# Patient Record
Sex: Female | Born: 1985
Health system: Southern US, Community
[De-identification: ages and names within clinical notes are randomized; demographics above are authoritative.]

## PROBLEM LIST (undated history)

## (undated) ENCOUNTER — Inpatient Hospital Stay (HOSPITAL_COMMUNITY): Payer: Self-pay

## (undated) DIAGNOSIS — I00 Rheumatic fever without heart involvement: Secondary | ICD-10-CM

## (undated) DIAGNOSIS — K219 Gastro-esophageal reflux disease without esophagitis: Secondary | ICD-10-CM

## (undated) DIAGNOSIS — D649 Anemia, unspecified: Secondary | ICD-10-CM

## (undated) DIAGNOSIS — G47419 Narcolepsy without cataplexy: Secondary | ICD-10-CM

## (undated) DIAGNOSIS — J302 Other seasonal allergic rhinitis: Secondary | ICD-10-CM

## (undated) HISTORY — PX: TONSILLECTOMY: SUR1361

## (undated) HISTORY — PX: WISDOM TOOTH EXTRACTION: SHX21

---

## 2014-02-23 NOTE — L&D Delivery Note (Signed)
Final Labor Progress Note Around midnight pt arrived to MAU c/o ROM at 2245.  I was reported she was 3cm.  When she arrived on L&D the primary care nurse check and she was 6-7cm.  ABX was changed from PCN to AMP for advanced labor.  Intermitting monitored was order, pt ambulating in the room, using the birth ball, and the peanut ball.  At 0200 pt denies feeling any pressure.  At 0415 I was called to the room for a decel.  FSE applied, scalp stim, O2, IVF bolus.  Faculty attending and Dr Normand Sloop called to the bedside at 0430.  Dr Emelda Fear at the bedside at 0432, FHR up to 120's.  FHT remained in the 120 with occasional variable decel, over all remain reassuring.  Dr Normand Sloop at the bedside at [phone].  Pushing continued, Dr Normand Sloop in the room at all times.    Vaginal Delivery Note Spontaneous rupture of membranes Sept 4, 2016 at 2245, clear.  GBS was positive, AMP x 2 doses were given.  Cervical dilation was complete at  0420.  NICHD Category 2.    Pushing with guidance began at  0420.   After 1 hour(s) and  48 minutes of pushing the head, shoulders and the body of a viable female infant "[name]" delivered spontaneously with maternal effort in the ROA position at 0608.   With vigorous tone and spontaneous cry, the infant was placed on moms abd.  After the umbilical cord was clamped it was cut by the FOB, then cord blood was obtained for evaluation. Spontaneous delivery of a intact placenta with a 3 vessel cord via Shultz at [phone].   Episiotomy: None   The vulva, perineum, vaginal vault, rectum and cervix were inspected and revealed a 1 degree vaginal, repaired using a 3-0 vicryl on a CT needle and a superficial bilateral inner labial which was repaired using a 4-0 vicryl on a SH needle with 30cc of 1% lidocaine. Postpartum pitocin as ordered.  Fundus firm, lochia minimum, bleeding under control. EBL 150, Pt hemodynamically stable.   Sponge, laps and needle count correct and verified with the primary care  nurse.  Attending MD available at all times.    Routine postpartum orders   Mother unsure about method of contraception Infant to have in patient circumcision   Placenta to pathology: NO     Cord Gases sent to lab: NO Cord blood sent to lab: YES   APGARS:  8 at 1 minute and 9 at 5 minutes Weight:. 8lb 14.2oz     Both mom and baby were left in stable condition, baby skin to skin.      Erin Garner, CNM, MSN 10/29/2014. 7:48 AM

## 2014-03-07 LAB — OB RESULTS CONSOLE ABO/RH: RH Type: POSITIVE

## 2014-03-07 LAB — OB RESULTS CONSOLE HEPATITIS B SURFACE ANTIGEN: Hepatitis B Surface Ag: NEGATIVE

## 2014-03-07 LAB — OB RESULTS CONSOLE GC/CHLAMYDIA
CHLAMYDIA, DNA PROBE: NEGATIVE
GC PROBE AMP, GENITAL: NEGATIVE

## 2014-03-07 LAB — OB RESULTS CONSOLE RPR: RPR: NONREACTIVE

## 2014-03-07 LAB — OB RESULTS CONSOLE RUBELLA ANTIBODY, IGM: Rubella: NON-IMMUNE/NOT IMMUNE

## 2014-03-07 LAB — OB RESULTS CONSOLE ANTIBODY SCREEN: ANTIBODY SCREEN: NEGATIVE

## 2014-03-07 LAB — OB RESULTS CONSOLE HIV ANTIBODY (ROUTINE TESTING): HIV: NONREACTIVE

## 2014-10-02 LAB — OB RESULTS CONSOLE GBS: STREP GROUP B AG: POSITIVE

## 2014-10-28 ENCOUNTER — Encounter (HOSPITAL_COMMUNITY): Payer: Self-pay | Admitting: *Deleted

## 2014-10-28 ENCOUNTER — Inpatient Hospital Stay (HOSPITAL_COMMUNITY)
Admission: AD | Admit: 2014-10-28 | Discharge: 2014-10-31 | DRG: 775 | Disposition: A | Discharge status: Home / Self Care | Payer: BLUE CROSS/BLUE SHIELD | Source: Ambulatory Visit | Attending: Obstetrics and Gynecology | Admitting: Obstetrics and Gynecology

## 2014-10-28 DIAGNOSIS — O9902 Anemia complicating childbirth: Principal | ICD-10-CM | POA: Diagnosis present

## 2014-10-28 DIAGNOSIS — B951 Streptococcus, group B, as the cause of diseases classified elsewhere: Secondary | ICD-10-CM | POA: Diagnosis present

## 2014-10-28 DIAGNOSIS — O99824 Streptococcus B carrier state complicating childbirth: Secondary | ICD-10-CM | POA: Diagnosis present

## 2014-10-28 DIAGNOSIS — Z3A39 39 weeks gestation of pregnancy: Secondary | ICD-10-CM | POA: Diagnosis present

## 2014-10-28 DIAGNOSIS — G47419 Narcolepsy without cataplexy: Secondary | ICD-10-CM | POA: Diagnosis present

## 2014-10-28 DIAGNOSIS — D649 Anemia, unspecified: Secondary | ICD-10-CM | POA: Diagnosis present

## 2014-10-28 DIAGNOSIS — Z882 Allergy status to sulfonamides status: Secondary | ICD-10-CM

## 2014-10-28 HISTORY — DX: Rheumatic fever without heart involvement: I00

## 2014-10-28 NOTE — MAU Note (Signed)
Contractions off/on, ? ROM at 2245.

## 2014-10-29 ENCOUNTER — Inpatient Hospital Stay (HOSPITAL_COMMUNITY): Payer: BLUE CROSS/BLUE SHIELD | Admitting: Anesthesiology

## 2014-10-29 ENCOUNTER — Encounter (HOSPITAL_COMMUNITY): Payer: Self-pay | Admitting: *Deleted

## 2014-10-29 DIAGNOSIS — D649 Anemia, unspecified: Secondary | ICD-10-CM | POA: Diagnosis present

## 2014-10-29 DIAGNOSIS — Z3A39 39 weeks gestation of pregnancy: Secondary | ICD-10-CM | POA: Diagnosis present

## 2014-10-29 DIAGNOSIS — Z882 Allergy status to sulfonamides status: Secondary | ICD-10-CM | POA: Diagnosis not present

## 2014-10-29 DIAGNOSIS — G47419 Narcolepsy without cataplexy: Secondary | ICD-10-CM | POA: Diagnosis present

## 2014-10-29 DIAGNOSIS — O9902 Anemia complicating childbirth: Secondary | ICD-10-CM | POA: Diagnosis present

## 2014-10-29 DIAGNOSIS — O99824 Streptococcus B carrier state complicating childbirth: Secondary | ICD-10-CM | POA: Diagnosis present

## 2014-10-29 DIAGNOSIS — B951 Streptococcus, group B, as the cause of diseases classified elsewhere: Secondary | ICD-10-CM | POA: Diagnosis present

## 2014-10-29 LAB — CBC
HEMATOCRIT: 32.7 % — AB (ref 36.0–46.0)
Hemoglobin: 10.9 g/dL — ABNORMAL LOW (ref 12.0–15.0)
MCH: 29.4 pg (ref 26.0–34.0)
MCHC: 33.3 g/dL (ref 30.0–36.0)
MCV: 88.1 fL (ref 78.0–100.0)
Platelets: 137 10*3/uL — ABNORMAL LOW (ref 150–400)
RBC: 3.71 MIL/uL — AB (ref 3.87–5.11)
RDW: 14 % (ref 11.5–15.5)
WBC: 10 10*3/uL (ref 4.0–10.5)

## 2014-10-29 LAB — TYPE AND SCREEN
ABO/RH(D): AB POS
Antibody Screen: NEGATIVE

## 2014-10-29 LAB — AMNISURE RUPTURE OF MEMBRANE (ROM) NOT AT ARMC: AMNISURE: POSITIVE

## 2014-10-29 LAB — ABO/RH: ABO/RH(D): AB POS

## 2014-10-29 LAB — RPR: RPR: NONREACTIVE

## 2014-10-29 MED ORDER — ACETAMINOPHEN 325 MG PO TABS: 650.0000 mg | ORAL_TABLET | ORAL | Status: DC | PRN

## 2014-10-29 MED ORDER — EPHEDRINE 5 MG/ML INJ
10.0000 mg | INTRAVENOUS | Status: DC | PRN
  Filled 2014-10-29: qty 2

## 2014-10-29 MED ORDER — ZOLPIDEM TARTRATE 5 MG PO TABS: 5.0000 mg | ORAL_TABLET | Freq: Every evening | ORAL | Status: DC | PRN

## 2014-10-29 MED ORDER — LIDOCAINE HCL (PF) 1 % IJ SOLN
30.0000 mL | INTRAMUSCULAR | Status: DC | PRN
  Administered 2014-10-29: 30 mL via SUBCUTANEOUS
  Filled 2014-10-29: qty 30

## 2014-10-29 MED ORDER — SENNOSIDES-DOCUSATE SODIUM 8.6-50 MG PO TABS
2.0000 | ORAL_TABLET | ORAL | Status: DC
  Administered 2014-10-29 – 2014-10-30 (×2): 2 via ORAL
  Filled 2014-10-29 (×3): qty 2

## 2014-10-29 MED ORDER — WITCH HAZEL-GLYCERIN EX PADS: 1.0000 "application " | MEDICATED_PAD | CUTANEOUS | Status: DC | PRN

## 2014-10-29 MED ORDER — ONDANSETRON HCL 4 MG PO TABS: 4.0000 mg | ORAL_TABLET | ORAL | Status: DC | PRN

## 2014-10-29 MED ORDER — OXYCODONE-ACETAMINOPHEN 5-325 MG PO TABS
2.0000 | ORAL_TABLET | ORAL | Status: DC | PRN
Start: 2014-10-29 — End: 2014-10-29

## 2014-10-29 MED ORDER — LACTATED RINGERS IV SOLN
INTRAVENOUS | Status: DC
  Administered 2014-10-29: 01:00:00 via INTRAVENOUS

## 2014-10-29 MED ORDER — FENTANYL 2.5 MCG/ML BUPIVACAINE 1/10 % EPIDURAL INFUSION (WH - ANES)
14.0000 mL/h | INTRAMUSCULAR | Status: DC | PRN
  Filled 2014-10-29: qty 125

## 2014-10-29 MED ORDER — IBUPROFEN 600 MG PO TABS
600.0000 mg | ORAL_TABLET | Freq: Four times a day (QID) | ORAL | Status: DC
  Administered 2014-10-29 – 2014-10-31 (×10): 600 mg via ORAL
  Filled 2014-10-29 (×11): qty 1

## 2014-10-29 MED ORDER — OXYCODONE-ACETAMINOPHEN 5-325 MG PO TABS
1.0000 | ORAL_TABLET | ORAL | Status: DC | PRN
  Administered 2014-10-29: 1 via ORAL
  Filled 2014-10-29: qty 1

## 2014-10-29 MED ORDER — OXYTOCIN BOLUS FROM INFUSION: 500.0000 mL | INTRAVENOUS | Status: DC

## 2014-10-29 MED ORDER — TETANUS-DIPHTH-ACELL PERTUSSIS 5-2.5-18.5 LF-MCG/0.5 IM SUSP: 0.5000 mL | Freq: Once | INTRAMUSCULAR | Status: DC

## 2014-10-29 MED ORDER — OXYTOCIN 40 UNITS IN LACTATED RINGERS INFUSION - SIMPLE MED
62.5000 mL/h | INTRAVENOUS | Status: DC
  Administered 2014-10-29: 62.5 mL/h via INTRAVENOUS
  Filled 2014-10-29: qty 1000

## 2014-10-29 MED ORDER — OXYTOCIN 10 UNIT/ML IJ SOLN
INTRAMUSCULAR | Status: AC
  Filled 2014-10-29: qty 1

## 2014-10-29 MED ORDER — SODIUM CHLORIDE 0.9 % IV SOLN
2.0000 g | Freq: Four times a day (QID) | INTRAVENOUS | Status: DC
  Administered 2014-10-29: 2 g via INTRAVENOUS
  Filled 2014-10-29 (×3): qty 2000

## 2014-10-29 MED ORDER — LACTATED RINGERS IV SOLN: 500.0000 mL | INTRAVENOUS | Status: DC | PRN

## 2014-10-29 MED ORDER — LANOLIN HYDROUS EX OINT: TOPICAL_OINTMENT | CUTANEOUS | Status: DC | PRN

## 2014-10-29 MED ORDER — OXYCODONE-ACETAMINOPHEN 5-325 MG PO TABS: 2.0000 | ORAL_TABLET | ORAL | Status: DC | PRN

## 2014-10-29 MED ORDER — ONDANSETRON HCL 4 MG/2ML IJ SOLN: 4.0000 mg | INTRAMUSCULAR | Status: DC | PRN

## 2014-10-29 MED ORDER — OXYCODONE-ACETAMINOPHEN 5-325 MG PO TABS: 1.0000 | ORAL_TABLET | ORAL | Status: DC | PRN

## 2014-10-29 MED ORDER — PHENYLEPHRINE 40 MCG/ML (10ML) SYRINGE FOR IV PUSH (FOR BLOOD PRESSURE SUPPORT)
80.0000 ug | PREFILLED_SYRINGE | INTRAVENOUS | Status: DC | PRN
  Filled 2014-10-29: qty 20
  Filled 2014-10-29: qty 2

## 2014-10-29 MED ORDER — NALBUPHINE HCL 10 MG/ML IJ SOLN
5.0000 mg | INTRAMUSCULAR | Status: DC | PRN
  Filled 2014-10-29: qty 0.5

## 2014-10-29 MED ORDER — BENZOCAINE-MENTHOL 20-0.5 % EX AERO: 1.0000 "application " | INHALATION_SPRAY | CUTANEOUS | Status: DC | PRN

## 2014-10-29 MED ORDER — PENICILLIN G POTASSIUM 5000000 UNITS IJ SOLR
5.0000 10*6.[IU] | Freq: Once | INTRAVENOUS | Status: DC
  Filled 2014-10-29: qty 5

## 2014-10-29 MED ORDER — DIPHENHYDRAMINE HCL 50 MG/ML IJ SOLN: 12.5000 mg | INTRAMUSCULAR | Status: DC | PRN

## 2014-10-29 MED ORDER — CITRIC ACID-SODIUM CITRATE 334-500 MG/5ML PO SOLN
30.0000 mL | ORAL | Status: DC | PRN
  Filled 2014-10-29 (×2): qty 15

## 2014-10-29 MED ORDER — PENICILLIN G POTASSIUM 5000000 UNITS IJ SOLR
2.5000 10*6.[IU] | INTRAVENOUS | Status: DC
  Filled 2014-10-29 (×4): qty 2.5

## 2014-10-29 MED ORDER — MEASLES, MUMPS & RUBELLA VAC ~~LOC~~ INJ
0.5000 mL | INJECTION | Freq: Once | SUBCUTANEOUS | Status: AC
  Administered 2014-10-31: 0.5 mL via SUBCUTANEOUS
  Filled 2014-10-29 (×2): qty 0.5

## 2014-10-29 MED ORDER — DIBUCAINE 1 % RE OINT: 1.0000 "application " | TOPICAL_OINTMENT | RECTAL | Status: DC | PRN

## 2014-10-29 MED ORDER — DIPHENHYDRAMINE HCL 25 MG PO CAPS: 25.0000 mg | ORAL_CAPSULE | Freq: Four times a day (QID) | ORAL | Status: DC | PRN

## 2014-10-29 MED ORDER — SIMETHICONE 80 MG PO CHEW: 80.0000 mg | CHEWABLE_TABLET | ORAL | Status: DC | PRN

## 2014-10-29 MED ORDER — ONDANSETRON HCL 4 MG/2ML IJ SOLN: 4.0000 mg | Freq: Four times a day (QID) | INTRAMUSCULAR | Status: DC | PRN

## 2014-10-29 MED ORDER — PRENATAL MULTIVITAMIN CH
1.0000 | ORAL_TABLET | Freq: Every day | ORAL | Status: DC
  Administered 2014-10-29 – 2014-10-31 (×3): 1 via ORAL
  Filled 2014-10-29 (×3): qty 1

## 2014-10-29 NOTE — Anesthesia Preprocedure Evaluation (Addendum)
Anesthesia Evaluation  Patient identified by MRN, date of birth, ID band Patient awake    Reviewed: Allergy & Precautions, H&P , Patient's Chart, lab work & pertinent test results  Airway Mallampati: I  TM Distance: >3 FB Neck ROM: full    Dental no notable dental hx.    Pulmonary neg pulmonary ROS,    Pulmonary exam normal        Cardiovascular negative cardio ROS Normal cardiovascular exam     Neuro/Psych negative neurological ROS  negative psych ROS   GI/Hepatic negative GI ROS, Neg liver ROS,   Endo/Other  negative endocrine ROS  Renal/GU negative Renal ROS     Musculoskeletal   Abdominal Normal abdominal exam  (+)   Peds  Hematology negative hematology ROS (+)   Anesthesia Other Findings   Reproductive/Obstetrics (+) Pregnancy                             Anesthesia Physical Anesthesia Plan  ASA: II  Anesthesia Plan: Epidural   Post-op Pain Management:    Induction:   Airway Management Planned:   Additional Equipment:   Intra-op Plan:   Post-operative Plan:   Informed Consent: I have reviewed the patients History and Physical, chart, labs and discussed the procedure including the risks, benefits and alternatives for the proposed anesthesia with the patient or authorized representative who has indicated his/her understanding and acceptance.     Plan Discussed with:   Anesthesia Plan Comments:         Anesthesia Quick Evaluation  

## 2014-10-29 NOTE — Lactation Note (Addendum)
This note was copied from the chart of Erin Garner. Lactation Consultation Note  P1, Baby 11 hours old. Breastfedx7, 1 stool, no void. Mother knows how to hand express and has viewed colostrum. Latched in cradle upon entering.  Discussed how to latch in cross cradle to increase depth and massage to keep him active. Reviewed how to [facility], basics and cluster feeding. Discussed pp 20-24 Baby & Me booklet. Mom encouraged to feed baby 8-12 times/24 hours and with feeding cues.  Mom made aware of O/P services, breastfeeding support groups, community resources, and our phone # for post-discharge questions.     Patient Name: Erin Garner [id number] Date: 10/29/2014 Reason for consult: Initial assessment   Maternal Data Has patient been taught Hand Expression?: Yes Does the patient have breastfeeding experience prior to this delivery?: No  Feeding Feeding Type: Breast Fed Length of feed: 20 min  LATCH Score/Interventions Latch: Grasps breast easily, tongue down, lips flanged, rhythmical sucking.  Audible Swallowing: A few with stimulation  Type of Nipple: Everted at rest and after stimulation  Comfort (Breast/Nipple): Soft / non-tender     Hold (Positioning): No assistance needed to correctly position infant at breast.  LATCH Score: 9  Lactation Tools Discussed/Used     Consult Status Consult Status: Follow-up Date: 10/30/14 Follow-up type: In-patient    Dahlia Byes [facility] 10/29/2014, 5:46 PM

## 2014-10-29 NOTE — H&P (Signed)
Erin Garner is a 29 y.o. female, G1 P0 at 39.5 weeks arrived in MAU SROM at 2245 clear.    Patient Active Problem List   Diagnosis Date Noted  . Normal labor 10/29/2014    Pregnancy Course: Patient entered care at 8 weeks.   EDC of 10/31/14 was established by LMP.      [place] evaluations:   8.0 weeks - Dating U/S.  Singleton IUP. + FHT's of 168 bpm. Anteverted ut. Amnion and YS seen. Normal fluid. CRL = GA of [redacted]w[redacted]d. EDD 10/31/14. U/S is best dating. Cx closed. Nl ovaries/adnexas.  19.5 weeks - Anatomy: FHR 153, Ht chambers not seen, breech, posterior placenta, no previa      23.3 weeks FU: EFW 1lb 7oz - 57%, FHR 143, cervix 3.68, breech, posterior placenta, normal fluid, when evaluating the pulmonary veins and pulmonary artery there are vessels that can not be accounted for.  Possibel anomalous pulmonary vellels, suggest fetal echo     Significant prenatal events:   Anemia, Narcolepsy, allergy to sulfa   Last evaluation:   39.0 weeks   VE:2/50/-1  Reason for admission:  SROM  Pt States:   Contractions Frequency: 3-5         Contraction severity: moderate         Fetal activity: +FM  OB History    Gravida Para Term Preterm AB TAB SAB Ectopic Multiple Living   1              Past Medical History  Diagnosis Date  . Rheumatic fever     as a child   Past Surgical History  Procedure Laterality Date  . Tonsillectomy    . Wisdom tooth extraction     Family History: family history is not on file. Social History:  reports that she has never smoked. She has never used smokeless tobacco. She reports that she does not drink alcohol or use illicit drugs.   Prenatal Transfer Tool  Maternal Diabetes: No Genetic Screening: Normal Maternal Ultrasounds/Referrals: Normal Fetal Ultrasounds or other Referrals:  Referred to Materal Fetal Medicine for fetal echo Maternal Substance Abuse:  No Significant Maternal Medications:  None Significant Maternal Lab Results: Lab values include: Group  B Strep positive   ROS:  See HPI above, all other systems are negative  Allergies  Allergen Reactions  . Sulfa Antibiotics Hives      Blood pressure 136/74, pulse 79, temperature 97.8 F (36.6 C), temperature source Oral, resp. rate 16, height 5\' 6"  (1.676 m), weight 179 lb (81.194 kg), SpO2 100 %.  Maternal Exam:  Uterine Assessment: Contraction frequency is rare.  Abdomen: Gravid, non tender. Fundal height is aga.  Normal external genitalia, vulva, cervix, uterus and adnexa.  No lesions noted on exam.  Pelvis adequate for delivery.  Fetal presentation: Vertex by Leopold's   Fetal Exam:  Monitor Surveillance : Continuous Monitoring  Mode: Ultrasound.  NICHD: Category 1 CTXs: Q 3-32minutes EFW   7 lbs  Physical Exam: Nursing note and vitals reviewed General: alert and cooperative She appears well nourished Psychiatric: Normal mood and affect. Her behavior is normal Head: Normocephalic Eyes: Pupils are equal, round, and reactive to light Neck: Normal range of motion Cardiovascular: RRR without murmur  Respiratory: CTAB. Effort normal  Abd: soft, non-tender, +BS, no rebound, no guarding  Genitourinary: Vagina normal  Neurological: A&Ox3 Skin: Warm and dry  Musculoskeletal: Normal range of motion  Homan's sign negative bilaterally No evidence of DVTs.  Edema: Minimal bilaterally non-pitting  edema DTR: 2+ Clonus: None   Prenatal labs: ABO, Rh: AB/Positive/-- (01/13 0000) Antibody: Negative (01/13 0000) Rubella:   Non Immune RPR: Nonreactive (01/13 0000)  HBsAg: Negative (01/13 0000)  HIV: Non-reactive (01/13 0000)  GBS: Positive (08/09 0000) Sickle cell/Hgb electrophoresis:  WNL Pap:   GC:   negative Chlamydia: negative Genetic screenings:   Glucola:    Assessment:  IUP at 39.5 weeks NICHD: Category 1 Membranes: SROM x 2hrs GBS positive VE 6-7/100-1 per nurse exam  Plan:  Admit to L&D for expectant/active management of labor. Possible augmentation  options reviewed including pitocin.   GBS prophylaxis with Ampicillin for advance labor  IV pain medication per orders PRN Epidural per patient request Foley cath after patient is comfortable with epidural Anticipate SVD Labor mgmt as ordered Okay to ambulate around unit with wireless monitors  Okay to get up and shower without monitoring   May auscultate FHR intermittently,  if expectant management     q 30 min in active labor - x 5 minutes     q 15 min in transition - before during and after a ctx     q 5 min with pushing - before during and after a ctx.     May ambulate without monitoring.     If no active labor, may do NST q 2 hours.   Attending MD available at all times.  Kenae Lindquist, CNM, MSN 10/29/2014, 12:50 AM

## 2014-10-30 LAB — CBC
HEMATOCRIT: 30.5 % — AB (ref 36.0–46.0)
HEMOGLOBIN: 9.9 g/dL — AB (ref 12.0–15.0)
MCH: 28.5 pg (ref 26.0–34.0)
MCHC: 32.5 g/dL (ref 30.0–36.0)
MCV: 87.9 fL (ref 78.0–100.0)
Platelets: 144 10*3/uL — ABNORMAL LOW (ref 150–400)
RBC: 3.47 MIL/uL — ABNORMAL LOW (ref 3.87–5.11)
RDW: 14.3 % (ref 11.5–15.5)
WBC: 14.5 10*3/uL — ABNORMAL HIGH (ref 4.0–10.5)

## 2014-10-30 NOTE — Lactation Note (Signed)
This note was copied from the chart of Erin Hilarie Sinha. Lactation Consultation Note; Mom reports that baby just finished feeding for 45 min. Reports baby has been nursing a lot today. Reviewed cluster feeding and encouraged to feed whenever she sees feeding cues. Reviewed engorgement prevention and treatment. Is going to get pump from insurance company at [employer]. No questions at present. To call prn  Patient Name: Erin Garner [id number] Date: 10/30/2014 Reason for consult: Follow-up assessment   Maternal Data Formula Feeding for Exclusion: No Does the patient have breastfeeding experience prior to this delivery?: No  Feeding Feeding Type: Breast Milk Length of feed: 45 min  LATCH Score/Interventions                      Lactation Tools Discussed/Used     Consult Status Consult Status: PRN    Pamelia Hoit 10/30/2014, 3:09 PM

## 2014-10-30 NOTE — Progress Notes (Signed)
Subjective: Postpartum Day 1: Vaginal delivery, 1st degree vaginal laceration, superficial bilateral inner labial lacerations Patient up ad lib, reports no syncope or dizziness. Feeding:  Breast Contraceptive plan:  Undecided at present  Using Motrin for pain with benefit.  Objective: Vital signs in last 24 hours: Temp:  [97.8 F (36.6 C)-98.1 F (36.7 C)] 97.8 F (36.6 C) (09/06 0535) Pulse Rate:  [70-77] 77 (09/06 0535) Resp:  [18] 18 (09/06 0535) BP: (110-121)/(59-72) 121/72 mmHg (09/06 0535)  Physical Exam:  General: alert, eyes with petechial hemorrhages from pushing Lochia: appropriate Uterine Fundus: firm Perineum: healing well DVT Evaluation: No evidence of DVT seen on physical exam. Negative Homan's sign.  CBC Latest Ref Rng 10/30/2014 10/29/2014  WBC 4.0 - 10.5 K/uL 14.5(H) 10.0  Hemoglobin 12.0 - 15.0 g/dL 3.2(G) 10.9(L)  Hematocrit 36.0 - 46.0 % 30.5(L) 32.7(L)  Platelets 150 - 400 K/uL 144(L) 137(L)  Orthostatics stable.    Assessment/Plan: Status post vaginal delivery day 1. Stable Continue current care. Takes Fe supplement at home--will continue pp. Plan for discharge tomorrow    Erin Garner 10/30/2014, 11:33 AM

## 2014-10-31 MED ORDER — OXYCODONE-ACETAMINOPHEN 5-325 MG PO TABS: 1.0000 | ORAL_TABLET | ORAL | Status: DC | PRN

## 2014-10-31 MED ORDER — IBUPROFEN 600 MG PO TABS: 600.0000 mg | ORAL_TABLET | Freq: Four times a day (QID) | ORAL | Status: DC

## 2014-10-31 NOTE — Lactation Note (Signed)
This note was copied from the chart of Erin Garner. Lactation Consultation Note  Patient Name: Erin Garner [id number] Date: 10/31/2014 Reason for consult: Follow-up assessment   Called back to room to assess latch. Baby initially latched in cradle position to right breast. Baby's lower lip tucked in and there was a clicking noise while baby nursing. Assisted mom to move into cross-cradle position and demonstrated how to flanger lower lip outward. Baby able to maintain a deep latch and intermittent swallows noted. Enc mom to maintain deep latch for good milk transfer.  Maternal Data    Feeding Feeding Type: Breast Fed Length of feed:  (LC assessed first 10 minutes of BF.)  LATCH Score/Interventions Latch: Grasps breast easily, tongue down, lips flanged, rhythmical sucking.  Audible Swallowing: Spontaneous and intermittent  Type of Nipple: Everted at rest and after stimulation  Comfort (Breast/Nipple): Soft / non-tender     Hold (Positioning): Assistance needed to correctly position infant at breast and maintain latch. Intervention(s): Support Pillows  LATCH Score: 9  Lactation Tools Discussed/Used     Consult Status Consult Status: PRN    Geralynn Ochs 10/31/2014, 1:31 PM

## 2014-10-31 NOTE — Lactation Note (Signed)
This note was copied from the chart of Erin Garner. Lactation Consultation Note  Patient Name: Erin Ayse Mccartin [id number] Date: 10/31/2014 Reason for consult: Follow-up assessment Baby 54 hours old. Mom called for [facility] with question about possible "blisters" on right nipple. This LC could not see a blister on nipple. Mom states that she is not have any nipple. Discussed normal nipple discomfort, and enc mom to maintain a deep latch while baby nursing. Mom states that her milk is "coming in" and she is feeling tight. Discussed engorgement prevention/treatment. Discussed longer nursing times with mom--1.5 hours and one for 2 hours. Mom states that baby is nursing 20-30 minutes at a time now. Baby is going home on phototherapy and mom's breasts are filling, so discussed having baby at breast often. Discussed that baby may be sleepy due to hyperbilirubinemia, so enc offering STS every 2-3 hours to see if baby will cue to nurse. Patient's RN, Herbert Seta is going to give mom a hand pump with instructions. Enc mom to use manual pump and hand expression after nursing as needed, and before nursing in order to soften breast for a deep latch as needed as well. Referred mom to Baby and Me handbook for EBM storage guidelines and number of diapers to expect by day of life. Mom aware of OP/BFSG and LC phone line assistance after D/C.   Maternal Data    Feeding    LATCH Score/Interventions                      Lactation Tools Discussed/Used     Consult Status Consult Status: PRN    Geralynn Ochs 10/31/2014, 1:04 PM

## 2014-10-31 NOTE — Discharge Summary (Signed)
Vaginal Delivery Discharge Summary  Erin Garner  DOB:    05-03-1985 MRN:    [id number] CSN:    [id number]  Date of admission:                  10/28/14  Date of discharge:                   10/31/14  Procedures this admission:   SVD  Date of Delivery: 10/29/14  Newborn Data:  Live born female  Birth Weight: 8 lb 14.2 oz (4030 g) APGAR: 8, 9  Home with mother. Name: [name] Circumcision Plan: in patient  History of Present Illness:  [name] is a 29 y.o. female, G1P1001, who presents at [redacted]w[redacted]d weeks gestation. The patient has been followed at [facility] of [facility]. She was admitted for rupture of membranes. Her pregnancy has been complicated by:  Patient Active Problem List   Diagnosis Date Noted  . Vaginal delivery 10/29/2014  . Positive GBS test 10/29/2014     Hospital Course:  The patient was admitted for SROM. Her GBS was positive.  Her labor was not complicated. Delivery was performed by [facility] Shaili Donalson without complication.  She proceeded to have a vaginal delivery of a healthy infant that remained in room with mother. Her postpartum course was not complicated. She was discharged to home in stable condition on postpartum day 2 doing well.  Intrapartum Procedures: spontaneous vaginal delivery and GBS prophylaxis Postpartum Procedures: none Complications-Operative and Postpartum: 1st degree perineal laceration  Discharge Diagnoses: Term Pregnancy-delivered  Feeding:  breast  Contraception:  unsure  Hemoglobin Results:  CBC Latest Ref Rng 10/30/2014 10/29/2014  WBC 4.0 - 10.5 K/uL 14.5(H) 10.0  Hemoglobin 12.0 - 15.0 g/dL 7.8(G) 10.9(L)  Hematocrit 36.0 - 46.0 % 30.5(L) 32.7(L)  Platelets 150 - 400 K/uL 144(L) 137(L)    Discharge Physical Exam:   General: alert and cooperative Lochia: appropriate Uterine Fundus: firm Incision: healing well DVT Evaluation: No evidence of DVT seen  on physical exam.   Discharge Information:  Activity:           pelvic rest Diet:                routine Medications: PNV, Ibuprofen, Iron and Percocet Condition:      stable Instructions:  Routine pp instructions   Discharge to: home      [name], CNM, MSN 10/31/2014. 9:28 AM      Postpartum Care After Vaginal Delivery  After you deliver your newborn (postpartum period), the usual stay in the hospital is 24 72 hours. If there were problems with your labor or delivery, or if you have other medical problems, you might be in the hospital longer.  While you are in the hospital, you will receive help and instructions on how to care for yourself and your newborn during the postpartum period.  While you are in the hospital:  Be sure to tell your nurses if you have pain or discomfort, as well as where you feel the pain and what makes the pain worse.  If you had an incision made near your vagina (episiotomy) or if you had some tearing during delivery, the nurses may put ice packs on your episiotomy or tear. The ice packs may help to reduce the pain and swelling.  If you are breastfeeding, you may feel uncomfortable contractions of your uterus for a couple of weeks. This is normal.  The contractions help your uterus get back to normal size.  It is normal to have some bleeding after delivery.  For the first 1 3 days after delivery, the flow is red and the amount may be similar to a period.  It is common for the flow to start and stop.  In the first few days, you may pass some small clots. Let your nurses know if you begin to pass large clots or your flow increases.  Do not  flush blood clots down the toilet before having the nurse look at them.  During the next 3 10 days after delivery, your flow should become more watery and pink or brown-tinged in color.  Ten to fourteen days after delivery, your flow should be a small amount of yellowish-white discharge.  The amount of  your flow will decrease over the first few weeks after delivery. Your flow may stop in 6 8 weeks. Most women have had their flow stop by 12 weeks after delivery.  You should change your sanitary pads frequently.  Wash your hands thoroughly with soap and water for at least 20 seconds after changing pads, using the toilet, or before holding or feeding your newborn.  You should feel like you need to empty your bladder within the first 6 8 hours after delivery.  In case you become weak, lightheaded, or faint, call your nurse before you get out of bed for the first time and before you take a shower for the first time.  Within the first few days after delivery, your breasts may begin to feel tender and full. This is called engorgement. Breast tenderness usually goes away within 48 72 hours after engorgement occurs. You may also notice milk leaking from your breasts. If you are not breastfeeding, do not stimulate your breasts. Breast stimulation can make your breasts produce more milk.  Spending as much time as possible with your newborn is very important. During this time, you and your newborn can feel close and get to know each other. Having your newborn stay in your room (rooming in) will help to strengthen the bond with your newborn. It will give you time to get to know your newborn and become comfortable caring for your newborn.  Your hormones change after delivery. Sometimes the hormone changes can temporarily cause you to feel sad or tearful. These feelings should not last more than a few days. If these feelings last longer than that, you should talk to your caregiver.  If desired, talk to your caregiver about methods of family planning or contraception.  Talk to your caregiver about immunizations. Your caregiver may want you to have the following immunizations before leaving the hospital:  Tetanus, diphtheria, and pertussis (Tdap) or tetanus and diphtheria (Td) immunization. It is very  important that you and your family (including grandparents) or others caring for your newborn are up-to-date with the Tdap or Td immunizations. The Tdap or Td immunization can help protect your newborn from getting ill.  Rubella immunization.  Varicella (chickenpox) immunization.  Influenza immunization. You should receive this annual immunization if you did not receive the immunization during your pregnancy. Document Released: 12/07/2006 Document Revised: 11/04/2011 Document Reviewed: 10/07/2011 [facility] Patient Information 2014 [place], [place].   Postpartum Depression and Baby Blues  The postpartum period begins right after the birth of a baby. During this time, there is often a great amount of joy and excitement. It is also a time of considerable changes in the life of the parent(s). Regardless of  how many times a mother gives birth, each child brings new challenges and dynamics to the family. It is not unusual to have feelings of excitement accompanied by confusing shifts in moods, emotions, and thoughts. All mothers are at risk of developing postpartum depression or the "baby blues." These mood changes can occur right after giving birth, or they may occur many months after giving birth. The baby blues or postpartum depression can be mild or severe. Additionally, postpartum depression can resolve rather quickly, or it can be a long-term condition. CAUSES Elevated hormones and their rapid decline are thought to be a main cause of postpartum depression and the baby blues. There are a number of hormones that radically change during and after pregnancy. Estrogen and progesterone usually decrease immediately after delivering your baby. The level of thyroid hormone and various cortisol steroids also rapidly drop. Other factors that play a major role in these changes include major life events and genetics.  RISK FACTORS If you have any of the following risks for the baby blues or postpartum depression,  know what symptoms to watch out for during the postpartum period. Risk factors that may increase the likelihood of getting the baby blues or postpartum depression include: 1. Havinga personal or family history of depression. 2. Having depression while being pregnant. 3. Having premenstrual or oral contraceptive-associated mood issues. 4. Having exceptional life stress. 5. Having marital conflict. 6. Lacking a social support network. 7. Having a baby with special needs. 8. Having health problems such as diabetes. SYMPTOMS Baby blues symptoms include:  Brief fluctuations in mood, such as going from extreme happiness to sadness.  Decreased concentration.  Difficulty sleeping.  Crying spells, tearfulness.  Irritability.  Anxiety. Postpartum depression symptoms typically begin within the first month after giving birth. These symptoms include:  Difficulty sleeping or excessive sleepiness.  Marked weight loss.  Agitation.  Feelings of worthlessness.  Lack of interest in activity or food. Postpartum psychosis is a very concerning condition and can be dangerous. Fortunately, it is rare. Displaying any of the following symptoms is cause for immediate medical attention. Postpartum psychosis symptoms include:  Hallucinations and delusions.  Bizarre or disorganized behavior.  Confusion or disorientation. DIAGNOSIS  A diagnosis is made by an evaluation of your symptoms. There are no medical or lab tests that lead to a diagnosis, but there are various questionnaires that a caregiver may use to identify those with the baby blues, postpartum depression, or psychosis. Often times, a screening tool called the [place] Postnatal Depression Scale is used to diagnose depression in the postpartum period.  TREATMENT The baby blues usually goes away on its own in 1 to 2 weeks. Social support is often all that is needed. You should be encouraged to get adequate sleep and rest. Occasionally, you  may be given medicines to help you sleep.  Postpartum depression requires treatment as it can last several months or longer if it is not treated. Treatment may include individual or group therapy, medicine, or both to address any social, physiological, and psychological factors that may play a role in the depression. Regular exercise, a healthy diet, rest, and social support may also be strongly recommended.  Postpartum psychosis is more serious and needs treatment right away. Hospitalization is often needed. HOME CARE INSTRUCTIONS  Get as much rest as you can. Nap when the baby sleeps.  Exercise regularly. Some women find yoga and walking to be beneficial.  Eat a balanced and nourishing diet.  Do little things that you enjoy.  Have a cup of tea, take a bubble bath, read your favorite magazine, or listen to your favorite music.  Avoid alcohol.  Ask for help with household chores, cooking, grocery shopping, or running errands as needed. Do not try to do everything.  Talk to people close to you about how you are feeling. Get support from your partner, family members, friends, or other new moms.  Try to stay positive in how you think. Think about the things you are grateful for.  Do not spend a lot of time alone.  Only take medicine as directed by your caregiver.  Keep all your postpartum appointments.  Let your caregiver know if you have any concerns. SEEK MEDICAL CARE IF: You are having a reaction or problems with your medicine. SEEK IMMEDIATE MEDICAL CARE IF:  You have suicidal feelings.  You feel you may harm the baby or someone else. Document Released: 11/14/2003 Document Revised: 05/04/2011 Document Reviewed: 12/16/2010 [facility] Patient Information 2014 [place], [place].     Breastfeeding Deciding to breastfeed is one of the best choices you can make for you and your baby. A change in hormones during pregnancy causes your breast tissue to grow and increases the number and  size of your milk ducts. These hormones also allow proteins, sugars, and fats from your blood supply to make breast milk in your milk-producing glands. Hormones prevent breast milk from being released before your baby is born as well as prompt milk flow after birth. Once breastfeeding has begun, thoughts of your baby, as well as his or her sucking or crying, can stimulate the release of milk from your milk-producing glands.  BENEFITS OF BREASTFEEDING For Your Baby  Your first milk (colostrum) helps your baby's digestive system function better.   There are antibodies in your milk that help your baby fight off infections.   Your baby has a lower incidence of asthma, allergies, and sudden infant death syndrome.   The nutrients in breast milk are better for your baby than infant formulas and are designed uniquely for your baby's needs.   Breast milk improves your baby's brain development.   Your baby is less likely to develop other conditions, such as childhood obesity, asthma, or type 2 diabetes mellitus.  For You   Breastfeeding helps to create a very special bond between you and your baby.   Breastfeeding is convenient. Breast milk is always available at the correct temperature and costs nothing.   Breastfeeding helps to burn calories and helps you lose the weight gained during pregnancy.   Breastfeeding makes your uterus contract to its prepregnancy size faster and slows bleeding (lochia) after you give birth.   Breastfeeding helps to lower your risk of developing type 2 diabetes mellitus, osteoporosis, and breast or ovarian cancer later in life. SIGNS THAT YOUR BABY IS HUNGRY Early Signs of Hunger  Increased alertness or activity.  Stretching.  Movement of the head from side to side.  Movement of the head and opening of the mouth when the corner of the mouth or cheek is stroked (rooting).  Increased sucking sounds, smacking lips, cooing, sighing, or  squeaking.  Hand-to-mouth movements.  Increased sucking of fingers or hands. Late Signs of Hunger  Fussing.  Intermittent crying. Extreme Signs of Hunger Signs of extreme hunger will require calming and consoling before your baby will be able to breastfeed successfully. Do not wait for the following signs of extreme hunger to occur before you initiate breastfeeding:   Restlessness.  A loud, strong cry.  Screaming.   BREASTFEEDING BASICS Breastfeeding Initiation  Find a comfortable place to sit or lie down, with your neck and back well supported.  Place a pillow or rolled up blanket under your baby to bring him or her to the level of your breast (if you are seated). Nursing pillows are specially designed to help support your arms and your baby while you breastfeed.  Make sure that your baby's abdomen is facing your abdomen.   Gently massage your breast. With your fingertips, massage from your chest wall toward your nipple in a circular motion. This encourages milk flow. You may need to continue this action during the feeding if your milk flows slowly.  Support your breast with 4 fingers underneath and your thumb above your nipple. Make sure your fingers are well away from your nipple and your baby's mouth.   Stroke your baby's lips gently with your finger or nipple.   When your baby's mouth is open wide enough, quickly bring your baby to your breast, placing your entire nipple and as much of the colored area around your nipple (areola) as possible into your baby's mouth.   More areola should be visible above your baby's upper lip than below the lower lip.   Your baby's tongue should be between his or her lower gum and your breast.   Ensure that your baby's mouth is correctly positioned around your nipple (latched). Your baby's lips should create a seal on your breast and be turned out (everted).  It is common for your baby to suck about 2-3 minutes in order to start  the flow of breast milk. Latching Teaching your baby how to latch on to your breast properly is very important. An improper latch can cause nipple pain and decreased milk supply for you and poor weight gain in your baby. Also, if your baby is not latched onto your nipple properly, he or she may swallow some air during feeding. This can make your baby fussy. Burping your baby when you switch breasts during the feeding can help to get rid of the air. However, teaching your baby to latch on properly is still the best way to prevent fussiness from swallowing air while breastfeeding. Signs that your baby has successfully latched on to your nipple:    Silent tugging or silent sucking, without causing you pain.   Swallowing heard between every 3-4 sucks.    Muscle movement above and in front of his or her ears while sucking.  Signs that your baby has not successfully latched on to nipple:   Sucking sounds or smacking sounds from your baby while breastfeeding.  Nipple pain. If you think your baby has not latched on correctly, slip your finger into the corner of your baby's mouth to break the suction and place it between your baby's gums. Attempt breastfeeding initiation again. Signs of Successful Breastfeeding Signs from your baby:   A gradual decrease in the number of sucks or complete cessation of sucking.   Falling asleep.   Relaxation of his or her body.   Retention of a small amount of milk in his or her mouth.   Letting go of your breast by himself or herself. Signs from you:  Breasts that have increased in firmness, weight, and size 1-3 hours after feeding.   Breasts that are softer immediately after breastfeeding.  Increased milk volume, as well as a change in milk consistency and color by the fifth day of breastfeeding.   Nipples that are not  sore, cracked, or bleeding. Signs That Your Pecola Leisure is Getting Enough Milk  Wetting at least 3 diapers in a 24-hour period. The  urine should be clear and pale yellow by age 37 days.  At least 3 stools in a 24-hour period by age 37 days. The stool should be soft and yellow.  At least 3 stools in a 24-hour period by age 60 days. The stool should be seedy and yellow.  No loss of weight greater than 10% of birth weight during the first 11 days of age.  Average weight gain of 4-7 ounces (113-198 g) per week after age 704 days.  Consistent daily weight gain by age 37 days, without weight loss after the age of 2 weeks. After a feeding, your baby may spit up a small amount. This is common. BREASTFEEDING FREQUENCY AND DURATION Frequent feeding will help you make more milk and can prevent sore nipples and breast engorgement. Breastfeed when you feel the need to reduce the fullness of your breasts or when your baby shows signs of hunger. This is called "breastfeeding on demand." Avoid introducing a pacifier to your baby while you are working to establish breastfeeding (the first 4-6 weeks after your baby is born). After this time you may choose to use a pacifier. Research has shown that pacifier use during the first year of a baby's life decreases the risk of sudden infant death syndrome (SIDS). Allow your baby to feed on each breast as long as he or she wants. Breastfeed until your baby is finished feeding. When your baby unlatches or falls asleep while feeding from the first breast, offer the second breast. Because newborns are often sleepy in the first few weeks of life, you may need to awaken your baby to get him or her to feed. Breastfeeding times will vary from baby to baby. However, the following rules can serve as a guide to help you ensure that your baby is properly fed:  Newborns (babies 31 weeks of age or younger) may breastfeed every 1-3 hours.  Newborns should not go longer than 3 hours during the day or 5 hours during the night without breastfeeding.  You should breastfeed your baby a minimum of 8 times in a 24-hour period  until you begin to introduce solid foods to your baby at around 44 months of age. BREAST MILK PUMPING Pumping and storing breast milk allows you to ensure that your baby is exclusively fed your breast milk, even at times when you are unable to breastfeed. This is especially important if you are going back to work while you are still breastfeeding or when you are not able to be present during feedings. Your lactation consultant can give you guidelines on how long it is safe to store breast milk.  A breast pump is a machine that allows you to pump milk from your breast into a sterile bottle. The pumped breast milk can then be stored in a refrigerator or freezer. Some breast pumps are operated by hand, while others use electricity. Ask your lactation consultant which type will work best for you. Breast pumps can be purchased, but some hospitals and breastfeeding support groups lease breast pumps on a monthly basis. A lactation consultant can teach you how to hand express breast milk, if you prefer not to use a pump.  CARING FOR YOUR BREASTS WHILE YOU BREASTFEED Nipples can become dry, cracked, and sore while breastfeeding. The following recommendations can help keep your breasts moisturized and healthy:  Avoid using  soap on your nipples.   Wear a supportive bra. Although not required, special nursing bras and tank tops are designed to allow access to your breasts for breastfeeding without taking off your entire bra or top. Avoid wearing underwire-style bras or extremely tight bras.  Air dry your nipples for 3-63minutes after each feeding.   Use only cotton bra pads to absorb leaked breast milk. Leaking of breast milk between feedings is normal.   Use lanolin on your nipples after breastfeeding. Lanolin helps to maintain your skin's normal moisture barrier. If you use pure lanolin, you do not need to wash it off before feeding your baby again. Pure lanolin is not toxic to your baby. You may also hand  express a few drops of breast milk and gently massage that milk into your nipples and allow the milk to air dry. In the first few weeks after giving birth, some women experience extremely full breasts (engorgement). Engorgement can make your breasts feel heavy, warm, and tender to the touch. Engorgement peaks within 3-5 days after you give birth. The following recommendations can help ease engorgement:  Completely empty your breasts while breastfeeding or pumping. You may want to start by applying warm, moist heat (in the shower or with warm water-soaked hand towels) just before feeding or pumping. This increases circulation and helps the milk flow. If your baby does not completely empty your breasts while breastfeeding, pump any extra milk after he or she is finished.  Wear a snug bra (nursing or regular) or tank top for 1-2 days to signal your body to slightly decrease milk production.  Apply ice packs to your breasts, unless this is too uncomfortable for you.  Make sure that your baby is latched on and positioned properly while breastfeeding. If engorgement persists after 48 hours of following these recommendations, contact your health care provider or a Advertising copywriter. OVERALL HEALTH CARE RECOMMENDATIONS WHILE BREASTFEEDING  Eat healthy foods. Alternate between meals and snacks, eating 3 of each per day. Because what you eat affects your breast milk, some of the foods may make your baby more irritable than usual. Avoid eating these foods if you are sure that they are negatively affecting your baby.  Drink milk, fruit juice, and water to satisfy your thirst (about 10 glasses a day).   Rest often, relax, and continue to take your prenatal vitamins to prevent fatigue, stress, and anemia.  Continue breast self-awareness checks.  Avoid chewing and smoking tobacco.  Avoid alcohol and drug use. Some medicines that may be harmful to your baby can pass through breast milk. It is important  to ask your health care provider before taking any medicine, including all over-the-counter and prescription medicine as well as vitamin and herbal supplements. It is possible to become pregnant while breastfeeding. If birth control is desired, ask your health care provider about options that will be safe for your baby. SEEK MEDICAL CARE IF:   You feel like you want to stop breastfeeding or have become frustrated with breastfeeding.  You have painful breasts or nipples.  Your nipples are cracked or bleeding.  Your breasts are red, tender, or warm.  You have a swollen area on either breast.  You have a fever or chills.  You have nausea or vomiting.  You have drainage other than breast milk from your nipples.  Your breasts do not become full before feedings by the fifth day after you give birth.  You feel sad and depressed.  Your baby is too  sleepy to eat well.  Your baby is having trouble sleeping.   Your baby is wetting less than 3 diapers in a 24-hour period.  Your baby has less than 3 stools in a 24-hour period.  Your baby's skin or the white part of his or her eyes becomes yellow.   Your baby is not gaining weight by 33 days of age. SEEK IMMEDIATE MEDICAL CARE IF:   Your baby is overly tired (lethargic) and does not want to wake up and feed.  Your baby develops an unexplained fever. Document Released: 02/09/2005 Document Revised: 02/14/2013 Document Reviewed: 08/03/2012 [facility] Patient Information 2015 [place], [place]. This information is not intended to replace advice given to you by your health care provider. Make sure you discuss any questions you have with your health care provider.

## 2017-01-22 LAB — OB RESULTS CONSOLE HIV ANTIBODY (ROUTINE TESTING): HIV: NONREACTIVE

## 2017-01-22 LAB — OB RESULTS CONSOLE RUBELLA ANTIBODY, IGM: RUBELLA: NON-IMMUNE/NOT IMMUNE

## 2017-01-22 LAB — OB RESULTS CONSOLE HEPATITIS B SURFACE ANTIGEN: Hepatitis B Surface Ag: NEGATIVE

## 2017-01-22 LAB — OB RESULTS CONSOLE RPR: RPR: NONREACTIVE

## 2017-01-25 LAB — OB RESULTS CONSOLE GC/CHLAMYDIA
Chlamydia: NEGATIVE
Gonorrhea: NEGATIVE

## 2017-02-23 NOTE — L&D Delivery Note (Signed)
Delivery Note At 2047 a viable female was delivered via spontaneous vaginal delivery (Presentation: direct OA).  APGAR: 8, 9; weight pending.    Placenta status: delivered spontaneously and completely.  Cord: three vessels with the following complications: none.    Anesthesia:  Epidural Episiotomy:  N/a Lacerations:  Small superficial periurethral and left labial tear, both were hemostatic and were reapproximated for patient comfort with patient consent  Suture Repair: 4.0 vicryl rapide  Est. Blood Loss (mL):  300  Mom to postpartum.  Baby to Couplet care / Skin to Skin.  Janeece RiggersEllis K Dereck Agerton 09/08/2017, 9:11 PM

## 2017-04-18 ENCOUNTER — Ambulatory Visit: Payer: Self-pay | Admitting: Emergency Medicine

## 2017-04-18 VITALS — BP 100/65 | HR 86 | Temp 98.2°F | Resp 16 | Wt 152.4 lb

## 2017-04-18 DIAGNOSIS — J069 Acute upper respiratory infection, unspecified: Secondary | ICD-10-CM

## 2017-04-18 DIAGNOSIS — R6889 Other general symptoms and signs: Secondary | ICD-10-CM

## 2017-04-18 LAB — POCT INFLUENZA A/B
INFLUENZA A, POC: NEGATIVE
Influenza B, POC: NEGATIVE

## 2017-04-18 MED ORDER — CROMOLYN SODIUM 5.2 MG/ACT NA AERS: 1.0000 | INHALATION_SPRAY | Freq: Four times a day (QID) | NASAL | 12 refills | Status: AC

## 2017-04-18 NOTE — Progress Notes (Signed)
Subjective:     Erin Garner is a 32 y.o. female who presents for evaluation of symptoms of a URI. Symptoms include achiness, congestion, no  fever, non productive cough and sore throat. Onset of symptoms was 1 days ago, and has been unchanged since that time. Treatment to date: none. She is [redacted] weeks pregnant with no known complications   Review of Systems Pertinent items noted in HPI and remainder of comprehensive ROS otherwise negative.   Objective:   Vitals:   04/18/17 1527  BP: 100/65  Pulse: 86  Resp: 16  Temp: 98.2 F (36.8 C)  SpO2: 97%   Physical Exam  Constitutional: She appears well-developed and well-nourished. No distress.  HENT:  Head: Normocephalic and atraumatic.  Right Ear: Tympanic membrane and external ear normal.  Left Ear: Tympanic membrane and external ear normal.  Mouth/Throat: Uvula is midline and oropharynx is clear and moist.  Eyes: Conjunctivae are normal.  Neck: Normal range of motion.  Cardiovascular: Normal rate and regular rhythm.  Pulmonary/Chest: Effort normal and breath sounds normal.  Abdominal: Soft.  Lymphadenopathy:    She has no cervical adenopathy.  Neurological: She is alert.  Skin: Skin is warm and dry. Capillary refill takes less than 2 seconds. She is not diaphoretic.  Psychiatric: She has a normal mood and affect.  Nursing note and vitals reviewed.   Assessment:    viral upper respiratory illness   Plan:    Discussed diagnosis and treatment of URI. Discussed the importance of avoiding unnecessary antibiotic therapy. Suggested symptomatic OTC remedies. Nasal saline spray for congestion. Follow up as needed. Nasalcrom for symptoms

## 2017-04-18 NOTE — Patient Instructions (Signed)

## 2017-04-21 ENCOUNTER — Telehealth: Payer: Self-pay

## 2017-04-21 NOTE — Telephone Encounter (Signed)
Called and spoke with pt and pt states she is feeling much better.

## 2017-08-02 ENCOUNTER — Inpatient Hospital Stay (HOSPITAL_COMMUNITY)
Admission: AD | Admit: 2017-08-02 | Discharge: 2017-08-02 | Disposition: A | Discharge status: Home / Self Care | Payer: 59 | Source: Ambulatory Visit | Attending: Obstetrics and Gynecology | Admitting: Obstetrics and Gynecology

## 2017-08-02 ENCOUNTER — Encounter (HOSPITAL_COMMUNITY): Payer: Self-pay | Admitting: *Deleted

## 2017-08-02 DIAGNOSIS — Z3A33 33 weeks gestation of pregnancy: Secondary | ICD-10-CM | POA: Insufficient documentation

## 2017-08-02 DIAGNOSIS — O479 False labor, unspecified: Secondary | ICD-10-CM

## 2017-08-02 DIAGNOSIS — Z882 Allergy status to sulfonamides status: Secondary | ICD-10-CM | POA: Diagnosis not present

## 2017-08-02 DIAGNOSIS — O4703 False labor before 37 completed weeks of gestation, third trimester: Secondary | ICD-10-CM | POA: Diagnosis not present

## 2017-08-02 LAB — URINALYSIS, ROUTINE W REFLEX MICROSCOPIC
BILIRUBIN URINE: NEGATIVE
GLUCOSE, UA: NEGATIVE mg/dL
Hgb urine dipstick: NEGATIVE
KETONES UR: NEGATIVE mg/dL
Leukocytes, UA: NEGATIVE
NITRITE: NEGATIVE
PH: 9 — AB (ref 5.0–8.0)
Protein, ur: NEGATIVE mg/dL
SPECIFIC GRAVITY, URINE: 1.003 — AB (ref 1.005–1.030)

## 2017-08-02 LAB — FETAL FIBRONECTIN: Fetal Fibronectin: NEGATIVE

## 2017-08-02 MED ORDER — NIFEDIPINE 10 MG PO CAPS: 10.0000 mg | ORAL_CAPSULE | Freq: Four times a day (QID) | ORAL | 0 refills | Status: DC | PRN

## 2017-08-02 NOTE — MAU Note (Signed)
Pt reports contractions all day, seen in office and was contracting and told to come here.

## 2017-08-02 NOTE — MAU Provider Note (Signed)
Chief Complaint:  Contractions    HPI: Erin Garner is a 32 y.o. G2P1001 at 33.[redacted] weeks gestation who presents to maternity admissions reporting contractions that started this morning and felt they were 4 mins apart, foggy brained, fatigue (not related to narcolepsy)  and nauseas. Pt endorses she had a HA this morning which resolved on its own, no medication needed, pt drank 40oz of water today. Pt denies v/d/rashes/sick contacts, no dysuria/vaginal irritation, abnormal discharge or itching. Denies leakage of fluid or vaginal bleeding. Good fetal movement. Earlier today pt stated she perceived her BB was not moving as much as he has been, but endorsed when she laid down and actively paid attention to his movement, she counted adequate feta kick counts.   Contractions:  Location: Abdomen Quality: dull, mild, able to talk through them  Severity: 3/10 in pain scale Duration: 30seconds Timing: Q4Mins Modifying factors: none Associated signs and symptoms: none  Pregnancy Problems anemia rheumatic fever as a child Narcolepsy not medicated   Allergy  sulfonamides  Medications ferrous sulfate Prenatal Vitamin Zyrtec  Pregnancy Course:   Past Medical History:  Diagnosis Date  . Rheumatic fever    as a child   OB History  Gravida Para Term Preterm AB Living  2 1 1     1   SAB TAB Ectopic Multiple Live Births        0 1    # Outcome Date GA Lbr Len/2nd Weight Sex Delivery Anes PTL Lv  2 Current           1 Term 10/29/14 7539w5d / 01:48 4.03 kg (8 lb 14.2 oz) M Vag-Spont None  LIV   Past Surgical History:  Procedure Laterality Date  . TONSILLECTOMY    . WISDOM TOOTH EXTRACTION     History reviewed. No pertinent family history. Social History   Tobacco Use  . Smoking status: Never Smoker  . Smokeless tobacco: Never Used  Substance Use Topics  . Alcohol use: No  . Drug use: No   Allergies  Allergen Reactions  . Sulfa Antibiotics Hives   Medications Prior to  Admission  Medication Sig Dispense Refill Last Dose  . acetaminophen (TYLENOL) 500 MG tablet Take 500 mg by mouth every 6 (six) hours as needed for mild pain or headache.    prn  . calcium carbonate (TUMS - DOSED IN MG ELEMENTAL CALCIUM) 500 MG chewable tablet Chew 1 tablet by mouth 2 (two) times daily as needed for indigestion or heartburn.   08/02/2017 at Unknown time  . cetirizine (ZYRTEC) 10 MG tablet Take 10 mg by mouth daily as needed for allergies or rhinitis.    08/02/2017 at Unknown time  . ferrous sulfate 325 (65 FE) MG tablet Take 325 mg by mouth daily with breakfast.   08/01/2017 at Unknown time  . Prenatal Vit-Fe Fumarate-FA (PRENATAL MULTIVITAMIN) TABS tablet Take 1 tablet by mouth daily at 12 noon.   08/01/2017 at Unknown time  . cromolyn (NASALCROM) 5.2 MG/ACT nasal spray Place 1 spray into both nostrils 4 (four) times daily. 26 mL 12     I have reviewed patient's Past Medical Hx, Surgical Hx, Family Hx, Social Hx, medications and allergies.   ROS:  Review of Systems  Constitutional: Positive for fatigue.  Gastrointestinal: Positive for abdominal pain and nausea.  All other systems reviewed and are negative.   Physical Exam   Patient Vitals for the past 24 hrs:  BP Temp Temp src Pulse Resp SpO2 Height Weight  08/02/17 1604 (!) 123/52 98.5 F (36.9 C) Oral 78 15 99 % 5\' 6"  (1.676 m) 82.1 kg (181 lb)   Constitutional: Well-developed, well-nourished female in no acute distress.  Cardiovascular: normal rate Respiratory: normal effort GI: Abd soft, non-tender, gravid appropriate for gestational age. Pos BS x 4 MS: Extremities nontender, no edema, normal ROM Neurologic: Alert and oriented x 4.  GU: Neg CVAT.  Pelvic: NEFG, physiologic discharge, no blood, cervix clean. Pelvic adequate for labor. No CMT  Dilation: (Outer os FT, inner os closed) Effacement (%): Thick Cervical Position: Posterior Station: Ballotable Exam by:: Philemon Kingdom NP Pelvic: Proven to 8lb 14 oz  NST:  FHR baseline 145 bpm, Variability: moderate, Accelerations:present, Decelerations:  Absent= Cat 1/Reactive UC:   None now, irregular contractions were seen at first when placed on the monitor Q2-27mins X3 contractions, then toco went quiet.  SVE:   Dilation: (Outer os FT, inner os closed) Effacement (%): Thick Station: Ballotable Exam by:: Philemon Kingdom NP,.  Leopold's: Position vertex, EFW 4.5-5lbs via leopold's.   Labs: Results for orders placed or performed during the hospital encounter of 08/02/17 (from the past 24 hour(s))  Urinalysis, Routine w reflex microscopic     Status: Abnormal   Collection Time: 08/02/17  4:18 PM  Result Value Ref Range   Color, Urine COLORLESS (A) YELLOW   APPearance CLEAR CLEAR   Specific Gravity, Urine 1.003 (L) 1.005 - 1.030   pH 9.0 (H) 5.0 - 8.0   Glucose, UA NEGATIVE NEGATIVE mg/dL   Hgb urine dipstick NEGATIVE NEGATIVE   Bilirubin Urine NEGATIVE NEGATIVE   Ketones, ur NEGATIVE NEGATIVE mg/dL   Protein, ur NEGATIVE NEGATIVE mg/dL   Nitrite NEGATIVE NEGATIVE   Leukocytes, UA NEGATIVE NEGATIVE  Fetal fibronectin     Status: None   Collection Time: 08/02/17  5:40 PM  Result Value Ref Range   Fetal Fibronectin NEGATIVE NEGATIVE    Imaging:  No results found.   Last [place] was 05/10/2017    MAU Course: Orders Placed This Encounter  Procedures  . Urinalysis, Routine w reflex microscopic  . Fetal fibronectin  . Diet - low sodium heart healthy  . Increase activity slowly  . Discharge patient Discharge disposition: 01-Home or Self Care; Discharge patient date: 08/02/2017   Meds ordered this encounter  Medications  . NIFEdipine (PROCARDIA) 10 MG capsule    Sig: Take 1 capsule (10 mg total) by mouth every 6 (six) hours as needed.    Dispense:  30 capsule    Refill:  0    Order Specific Question:   Supervising Provider    Answer:   Silverio Lay [[id number]]    MDM: NST-reactive, +FM, -UA, Monitor, check for cervical change if non d/c home.    Assessment: No diagnosis found.  Erin Garner is a 32 y.o. G1P1001 at 33.[redacted] weeks gestation with braxton hick contractions, reactive NST, Internal OS closed with no cervical change in one hour, no s/sx for uti, -UA, Cat 1 fetal tracing, pt stable.   Plan: Consulted with DR Rivard and agrees with pan of care.   Discharge home in stable condition.  Labor precautions and fetal kick counts Home with procardia 10mg  PO Q6H PRN for contractions, hold if dizzy or BP<90/60 If you feel a goush of fluids, or feel vaginal leakage go to MAU. D/C home to rest, hydrate, may take otc tylenol for pain and benadryl for sleep  Follow-up Information    [facility] Follow up in  2 week(s).   Specialty:  Obstetrics and Gynecology Contact information: [address] [place] [address] [phone]           [facility] NP-C, CNM [place], [place] 08/02/2017 7:00 PM

## 2017-08-02 NOTE — MAU Note (Signed)
Urine in lab 

## 2017-08-17 ENCOUNTER — Encounter (HOSPITAL_COMMUNITY): Payer: Self-pay | Admitting: *Deleted

## 2017-08-17 ENCOUNTER — Inpatient Hospital Stay (HOSPITAL_COMMUNITY)
Admission: AD | Admit: 2017-08-17 | Discharge: 2017-08-17 | Disposition: A | Discharge status: Home / Self Care | Payer: 59 | Source: Ambulatory Visit | Attending: Obstetrics and Gynecology | Admitting: Obstetrics and Gynecology

## 2017-08-17 DIAGNOSIS — Z3A35 35 weeks gestation of pregnancy: Secondary | ICD-10-CM | POA: Diagnosis not present

## 2017-08-17 DIAGNOSIS — O4700 False labor before 37 completed weeks of gestation, unspecified trimester: Secondary | ICD-10-CM

## 2017-08-17 DIAGNOSIS — O479 False labor, unspecified: Secondary | ICD-10-CM

## 2017-08-17 LAB — URINALYSIS, ROUTINE W REFLEX MICROSCOPIC
Bilirubin Urine: NEGATIVE
Glucose, UA: NEGATIVE mg/dL
Hgb urine dipstick: NEGATIVE
Ketones, ur: NEGATIVE mg/dL
Nitrite: NEGATIVE
PROTEIN: NEGATIVE mg/dL
Specific Gravity, Urine: 1.004 — ABNORMAL LOW (ref 1.005–1.030)
pH: 7 (ref 5.0–8.0)

## 2017-08-17 LAB — WET PREP, GENITAL
SPERM: NONE SEEN
TRICH WET PREP: NONE SEEN
YEAST WET PREP: NONE SEEN

## 2017-08-17 LAB — OB RESULTS CONSOLE GBS: GBS: NEGATIVE

## 2017-08-17 MED ORDER — NIFEDIPINE 10 MG PO CAPS
10.0000 mg | ORAL_CAPSULE | ORAL | Status: AC | PRN
  Administered 2017-08-17 (×2): 10 mg via ORAL
  Filled 2017-08-17 (×2): qty 1

## 2017-08-17 MED ORDER — LACTATED RINGERS IV BOLUS
1000.0000 mL | Freq: Once | INTRAVENOUS | Status: AC
  Administered 2017-08-17: 1000 mL via INTRAVENOUS

## 2017-08-17 NOTE — Discharge Instructions (Signed)
Braxton Hicks Contractions °Contractions of the uterus can occur throughout pregnancy, but they are not always a sign that you are in labor. You may have practice contractions called Braxton Hicks contractions. These false labor contractions are sometimes confused with true labor. °What are Braxton Hicks contractions? °Braxton Hicks contractions are tightening movements that occur in the muscles of the uterus before labor. Unlike true labor contractions, these contractions do not result in opening (dilation) and thinning of the cervix. Toward the end of pregnancy (32-34 weeks), Braxton Hicks contractions can happen more often and may become stronger. These contractions are sometimes difficult to tell apart from true labor because they can be very uncomfortable. You should not feel embarrassed if you go to the hospital with false labor. °Sometimes, the only way to tell if you are in true labor is for your health care provider to look for changes in the cervix. The health care provider will do a physical exam and may monitor your contractions. If you are not in true labor, the exam should show that your cervix is not dilating and your water has not broken. °If there are other health problems associated with your pregnancy, it is completely safe for you to be sent home with false labor. You may continue to have Braxton Hicks contractions until you go into true labor. °How to tell the difference between true labor and false labor °True labor °· Contractions last 30-70 seconds. °· Contractions become very regular. °· Discomfort is usually felt in the top of the uterus, and it spreads to the lower abdomen and low back. °· Contractions do not go away with walking. °· Contractions usually become more intense and increase in frequency. °· The cervix dilates and gets thinner. °False labor °· Contractions are usually shorter and not as strong as true labor contractions. °· Contractions are usually irregular. °· Contractions  are often felt in the front of the lower abdomen and in the groin. °· Contractions may go away when you walk around or change positions while lying down. °· Contractions get weaker and are shorter-lasting as time goes on. °· The cervix usually does not dilate or become thin. °Follow these instructions at home: °· Take over-the-counter and prescription medicines only as told by your health care provider. °· Keep up with your usual exercises and follow other instructions from your health care provider. °· Eat and drink lightly if you think you are going into labor. °· If Braxton Hicks contractions are making you uncomfortable: °? Change your position from lying down or resting to walking, or change from walking to resting. °? Sit and rest in a tub of warm water. °? Drink enough fluid to keep your urine pale yellow. Dehydration may cause these contractions. °? Do slow and deep breathing several times an hour. °· Keep all follow-up prenatal visits as told by your health care provider. This is important. °Contact a health care provider if: °· You have a fever. °· You have continuous pain in your abdomen. °Get help right away if: °· Your contractions become stronger, more regular, and closer together. °· You have fluid leaking or gushing from your vagina. °· You pass blood-tinged mucus (bloody show). °· You have bleeding from your vagina. °· You have low back pain that you never had before. °· You feel your baby’s head pushing down and causing pelvic pressure. °· Your baby is not moving inside you as much as it used to. °Summary °· Contractions that occur before labor are called Braxton   Hicks contractions, false labor, or practice contractions. °· Braxton Hicks contractions are usually shorter, weaker, farther apart, and less regular than true labor contractions. True labor contractions usually become progressively stronger and regular and they become more frequent. °· Manage discomfort from Braxton Hicks contractions by  changing position, resting in a warm bath, drinking plenty of water, or practicing deep breathing. °This information is not intended to replace advice given to you by your health care provider. Make sure you discuss any questions you have with your health care provider. °Document Released: 06/25/2016 Document Revised: 06/25/2016 Document Reviewed: 06/25/2016 °Elsevier Interactive Patient Education © 2018 [employer] ° °Fetal Movement Counts °Patient Name: ________________________________________________ Patient Due Date: ____________________ °What is a fetal movement count? °A fetal movement count is the number of times that you feel your baby move during a certain amount of time. This may also be called a fetal kick count. A fetal movement count is recommended for every pregnant woman. You may be asked to start counting fetal movements as early as week 28 of your pregnancy. °Pay attention to when your baby is most active. You may notice your baby's sleep and wake cycles. You may also notice things that make your baby move more. You should do a fetal movement count: °· When your baby is normally most active. °· At the same time each day. ° °A good time to count movements is while you are resting, after having something to eat and drink. °How do I count fetal movements? °1. Find a quiet, comfortable area. Sit, or lie down on your side. °2. Write down the date, the start time and stop time, and the number of movements that you felt between those two times. Take this information with you to your health care visits. °3. For 2 hours, count kicks, flutters, swishes, rolls, and jabs. You should feel at least 10 movements during 2 hours. °4. You may stop counting after you have felt 10 movements. °5. If you do not feel 10 movements in 2 hours, have something to eat and drink. Then, keep resting and counting for 1 hour. If you feel at least 4 movements during that hour, you may stop counting. °Contact a health care  provider if: °· You feel fewer than 4 movements in 2 hours. °· Your baby is not moving like he or she usually does. °Date: ____________ Start time: ____________ Stop time: ____________ Movements: ____________ °Date: ____________ Start time: ____________ Stop time: ____________ Movements: ____________ °Date: ____________ Start time: ____________ Stop time: ____________ Movements: ____________ °Date: ____________ Start time: ____________ Stop time: ____________ Movements: ____________ °Date: ____________ Start time: ____________ Stop time: ____________ Movements: ____________ °Date: ____________ Start time: ____________ Stop time: ____________ Movements: ____________ °Date: ____________ Start time: ____________ Stop time: ____________ Movements: ____________ °Date: ____________ Start time: ____________ Stop time: ____________ Movements: ____________ °Date: ____________ Start time: ____________ Stop time: ____________ Movements: ____________ °This information is not intended to replace advice given to you by your health care provider. Make sure you discuss any questions you have with your health care provider. °Document Released: 03/11/2006 Document Revised: 10/09/2015 Document Reviewed: 03/21/2015 °Elsevier Interactive Patient Education © 2018 Elsevier Inc[employer] ° °

## 2017-08-17 NOTE — MAU Provider Note (Addendum)
  History     CSN: [id number]  Arrival date and time: 08/17/17 1916   None     Chief Complaint  Patient presents with  . Contractions   Began having contractions at 1630, they have gotten closer together and stronger since then. She rates pain 5-6/10. Denies loss of fluid or vaginal bleeding. Reports good fetal movement. Denies urinary or vaginal symptoms.    OB History    Gravida  2   Para  1   Term  1   Preterm      AB      Living  1     SAB      TAB      Ectopic      Multiple  0   Live Births  1           Past Medical History:  Diagnosis Date  . Rheumatic fever    as a child    Past Surgical History:  Procedure Laterality Date  . TONSILLECTOMY    . WISDOM TOOTH EXTRACTION      History reviewed. No pertinent family history.  Social History   Tobacco Use  . Smoking status: Never Smoker  . Smokeless tobacco: Never Used  Substance Use Topics  . Alcohol use: No  . Drug use: No    Allergies:  Allergies  Allergen Reactions  . Sulfa Antibiotics Hives    No medications prior to admission.    Review of Systems  Gastrointestinal: Positive for abdominal pain.  Genitourinary: Negative for dysuria, frequency, hematuria, vaginal bleeding, vaginal discharge and vaginal pain.  All other systems reviewed and are negative.  Physical Exam   Blood pressure 121/63, pulse 87, temperature 98.7 F (37.1 C), temperature source Oral, resp. rate 18, height 5\' 6"  (1.676 m), weight 83.1 kg (183 lb 4 oz), unknown if currently breastfeeding.  Physical Exam  Nursing note and vitals reviewed. Constitutional: She is oriented to person, place, and time. She appears well-developed and well-nourished.  HENT:  Head: Normocephalic.  Eyes: Pupils are equal, round, and reactive to light.  Cardiovascular: Normal rate, regular rhythm and normal heart sounds.  Respiratory: Effort normal and breath sounds normal.  Genitourinary: Vagina normal and uterus normal.   Musculoskeletal: Normal range of motion.  Neurological: She is alert and oriented to person, place, and time.  Skin: Skin is warm and dry.  Psychiatric: She has a normal mood and affect. Her behavior is normal. Judgment and thought content normal.    MAU Course  Procedures GBS PCR UA Wet Prep   MDM UA does not indicate dehydration or infection. Wet prep and GBS PCR done. Patient took a dose of Procardia at home without effect, will give up to 2 more doses as needed to decrease contractions. LR IV to hydrate patient and possibly decrease contractions. Patient did not make cervical change. Discussed false labor vs early labor with patient. Patient to return if decreased fetal movement, SROM, vaginal bleeding, or contractions continue and she believes she is laboring. Patient has clue cells but asymptomatic for BV, will discuss results with her MD in the morning and let them decide if they wish to treat her.   Assessment and Plan  32 y.o G2P1 at 2215w4d Preterm contractions False labor vs. Early labor  Labor precautions discussed Discharge home   Erin Garner 08/18/2017, 12:01 AM

## 2017-08-17 NOTE — MAU Note (Signed)
PT SAYS UC STARTED AT 430PM.   PNC  WITH [facility]- .   PT SAYS SHE WAS HERE 2 WEEKS AGO ALSO- HOME ON PROCARDIA -  TOOK 1 PILL AT 445PM-  AND STILL UC.  SHE CALLED NURSE LINE- NO RETURN CALL.   DENIES HSV AND MRSA. LAST SEX-  3 WEEKS.  VE  3 WEEKS AGO 1 CM.

## 2017-08-19 LAB — CULTURE, BETA STREP (GROUP B ONLY)

## 2017-09-08 ENCOUNTER — Inpatient Hospital Stay (HOSPITAL_COMMUNITY)
Admission: AD | Admit: 2017-09-08 | Discharge: 2017-09-10 | DRG: 807 | Disposition: A | Discharge status: Home / Self Care | Payer: 59 | Source: Ambulatory Visit | Attending: Obstetrics and Gynecology | Admitting: Obstetrics and Gynecology

## 2017-09-08 ENCOUNTER — Other Ambulatory Visit: Payer: Self-pay

## 2017-09-08 ENCOUNTER — Encounter (HOSPITAL_COMMUNITY): Payer: Self-pay

## 2017-09-08 ENCOUNTER — Inpatient Hospital Stay (HOSPITAL_COMMUNITY): Payer: 59 | Admitting: Anesthesiology

## 2017-09-08 DIAGNOSIS — O9902 Anemia complicating childbirth: Secondary | ICD-10-CM | POA: Diagnosis present

## 2017-09-08 DIAGNOSIS — D649 Anemia, unspecified: Secondary | ICD-10-CM | POA: Diagnosis present

## 2017-09-08 DIAGNOSIS — Z3A38 38 weeks gestation of pregnancy: Secondary | ICD-10-CM

## 2017-09-08 DIAGNOSIS — Z3483 Encounter for supervision of other normal pregnancy, third trimester: Secondary | ICD-10-CM | POA: Diagnosis present

## 2017-09-08 LAB — CBC
HCT: 33.1 % — ABNORMAL LOW (ref 36.0–46.0)
Hemoglobin: 10.6 g/dL — ABNORMAL LOW (ref 12.0–15.0)
MCH: 26.2 pg (ref 26.0–34.0)
MCHC: 32 g/dL (ref 30.0–36.0)
MCV: 81.9 fL (ref 78.0–100.0)
Platelets: 165 10*3/uL (ref 150–400)
RBC: 4.04 MIL/uL (ref 3.87–5.11)
RDW: 14.1 % (ref 11.5–15.5)
WBC: 12.4 10*3/uL — ABNORMAL HIGH (ref 4.0–10.5)

## 2017-09-08 LAB — TYPE AND SCREEN
ABO/RH(D): AB POS
Antibody Screen: NEGATIVE

## 2017-09-08 LAB — OB RESULTS CONSOLE GBS: STREP GROUP B AG: NEGATIVE

## 2017-09-08 MED ORDER — OXYCODONE-ACETAMINOPHEN 5-325 MG PO TABS: 2.0000 | ORAL_TABLET | ORAL | Status: DC | PRN

## 2017-09-08 MED ORDER — OXYTOCIN 40 UNITS IN LACTATED RINGERS INFUSION - SIMPLE MED
2.5000 [IU]/h | INTRAVENOUS | Status: DC
  Filled 2017-09-08: qty 1000

## 2017-09-08 MED ORDER — ONDANSETRON HCL 4 MG/2ML IJ SOLN: 4.0000 mg | Freq: Four times a day (QID) | INTRAMUSCULAR | Status: DC | PRN

## 2017-09-08 MED ORDER — OXYCODONE-ACETAMINOPHEN 5-325 MG PO TABS: 1.0000 | ORAL_TABLET | ORAL | Status: DC | PRN

## 2017-09-08 MED ORDER — PHENYLEPHRINE 40 MCG/ML (10ML) SYRINGE FOR IV PUSH (FOR BLOOD PRESSURE SUPPORT)
80.0000 ug | PREFILLED_SYRINGE | INTRAVENOUS | Status: DC | PRN
  Filled 2017-09-08: qty 5

## 2017-09-08 MED ORDER — EPHEDRINE 5 MG/ML INJ: 10.0000 mg | INTRAVENOUS | Status: DC | PRN

## 2017-09-08 MED ORDER — LIDOCAINE HCL (PF) 1 % IJ SOLN
30.0000 mL | INTRAMUSCULAR | Status: DC | PRN
  Filled 2017-09-08: qty 30

## 2017-09-08 MED ORDER — WITCH HAZEL-GLYCERIN EX PADS: 1.0000 "application " | MEDICATED_PAD | CUTANEOUS | Status: DC | PRN

## 2017-09-08 MED ORDER — SOD CITRATE-CITRIC ACID 500-334 MG/5ML PO SOLN: 30.0000 mL | ORAL | Status: DC | PRN

## 2017-09-08 MED ORDER — PHENYLEPHRINE 40 MCG/ML (10ML) SYRINGE FOR IV PUSH (FOR BLOOD PRESSURE SUPPORT): 80.0000 ug | PREFILLED_SYRINGE | INTRAVENOUS | Status: DC | PRN

## 2017-09-08 MED ORDER — LACTATED RINGERS IV SOLN: 500.0000 mL | Freq: Once | INTRAVENOUS | Status: DC

## 2017-09-08 MED ORDER — PRENATAL MULTIVITAMIN CH
1.0000 | ORAL_TABLET | Freq: Every day | ORAL | Status: DC
  Administered 2017-09-09: 1 via ORAL
  Filled 2017-09-08: qty 1

## 2017-09-08 MED ORDER — FENTANYL CITRATE (PF) 100 MCG/2ML IJ SOLN: 50.0000 ug | INTRAMUSCULAR | Status: DC | PRN

## 2017-09-08 MED ORDER — IBUPROFEN 600 MG PO TABS
600.0000 mg | ORAL_TABLET | Freq: Four times a day (QID) | ORAL | Status: DC
  Administered 2017-09-08 – 2017-09-10 (×6): 600 mg via ORAL
  Filled 2017-09-08 (×6): qty 1

## 2017-09-08 MED ORDER — OXYTOCIN BOLUS FROM INFUSION
500.0000 mL | Freq: Once | INTRAVENOUS | Status: AC
  Administered 2017-09-08: 500 mL via INTRAVENOUS

## 2017-09-08 MED ORDER — ACETAMINOPHEN 325 MG PO TABS
650.0000 mg | ORAL_TABLET | ORAL | Status: DC | PRN
  Administered 2017-09-09 (×3): 650 mg via ORAL

## 2017-09-08 MED ORDER — LACTATED RINGERS IV SOLN: 500.0000 mL | INTRAVENOUS | Status: DC | PRN

## 2017-09-08 MED ORDER — ONDANSETRON HCL 4 MG PO TABS: 4.0000 mg | ORAL_TABLET | ORAL | Status: DC | PRN

## 2017-09-08 MED ORDER — FENTANYL 2.5 MCG/ML BUPIVACAINE 1/10 % EPIDURAL INFUSION (WH - ANES)
14.0000 mL/h | INTRAMUSCULAR | Status: DC | PRN
  Administered 2017-09-08: 14 mL/h via EPIDURAL

## 2017-09-08 MED ORDER — DIPHENHYDRAMINE HCL 50 MG/ML IJ SOLN: 12.5000 mg | INTRAMUSCULAR | Status: DC | PRN

## 2017-09-08 MED ORDER — DIBUCAINE 1 % RE OINT: 1.0000 "application " | TOPICAL_OINTMENT | RECTAL | Status: DC | PRN

## 2017-09-08 MED ORDER — COCONUT OIL OIL
1.0000 "application " | TOPICAL_OIL | Status: DC | PRN
  Administered 2017-09-08: 1 via TOPICAL

## 2017-09-08 MED ORDER — EPHEDRINE 5 MG/ML INJ
10.0000 mg | INTRAVENOUS | Status: DC | PRN
  Filled 2017-09-08: qty 2

## 2017-09-08 MED ORDER — BENZOCAINE-MENTHOL 20-0.5 % EX AERO
1.0000 "application " | INHALATION_SPRAY | CUTANEOUS | Status: DC | PRN
  Administered 2017-09-08: 1 via TOPICAL
  Filled 2017-09-08 (×2): qty 56

## 2017-09-08 MED ORDER — ONDANSETRON HCL 4 MG/2ML IJ SOLN: 4.0000 mg | INTRAMUSCULAR | Status: DC | PRN

## 2017-09-08 MED ORDER — FENTANYL 2.5 MCG/ML BUPIVACAINE 1/10 % EPIDURAL INFUSION (WH - ANES)
INTRAMUSCULAR | Status: AC
  Filled 2017-09-08: qty 100

## 2017-09-08 MED ORDER — DIPHENHYDRAMINE HCL 25 MG PO CAPS: 25.0000 mg | ORAL_CAPSULE | Freq: Four times a day (QID) | ORAL | Status: DC | PRN

## 2017-09-08 MED ORDER — TETANUS-DIPHTH-ACELL PERTUSSIS 5-2.5-18.5 LF-MCG/0.5 IM SUSP: 0.5000 mL | Freq: Once | INTRAMUSCULAR | Status: DC

## 2017-09-08 MED ORDER — PHENYLEPHRINE 40 MCG/ML (10ML) SYRINGE FOR IV PUSH (FOR BLOOD PRESSURE SUPPORT)
PREFILLED_SYRINGE | INTRAVENOUS | Status: AC
  Filled 2017-09-08: qty 10

## 2017-09-08 MED ORDER — LACTATED RINGERS IV SOLN
INTRAVENOUS | Status: DC
  Administered 2017-09-08: 18:00:00 via INTRAVENOUS

## 2017-09-08 MED ORDER — EPHEDRINE 5 MG/ML INJ
10.0000 mg | INTRAVENOUS | Status: DC | PRN
Start: 2017-09-08 — End: 2017-09-10
  Filled 2017-09-08: qty 2

## 2017-09-08 MED ORDER — ZOLPIDEM TARTRATE 5 MG PO TABS: 5.0000 mg | ORAL_TABLET | Freq: Every evening | ORAL | Status: DC | PRN

## 2017-09-08 MED ORDER — ACETAMINOPHEN 325 MG PO TABS
650.0000 mg | ORAL_TABLET | ORAL | Status: DC | PRN
  Filled 2017-09-08 (×3): qty 2

## 2017-09-08 MED ORDER — LIDOCAINE HCL (PF) 1 % IJ SOLN
INTRAMUSCULAR | Status: DC | PRN
  Administered 2017-09-08: 5 mL via EPIDURAL

## 2017-09-08 MED ORDER — SENNOSIDES-DOCUSATE SODIUM 8.6-50 MG PO TABS
2.0000 | ORAL_TABLET | ORAL | Status: DC
  Administered 2017-09-08 – 2017-09-10 (×2): 2 via ORAL
  Filled 2017-09-08 (×2): qty 2

## 2017-09-08 MED ORDER — SIMETHICONE 80 MG PO CHEW: 80.0000 mg | CHEWABLE_TABLET | ORAL | Status: DC | PRN

## 2017-09-08 NOTE — H&P (Signed)
Erin SalisburyCourtney C Garner is a 32 y.o. female G2P1001 @ 6175w5d  presenting for active labor, pregnancy complicated by chronic anemia OB History    Gravida  2   Para  1   Term  1   Preterm      AB      Living  1     SAB      TAB      Ectopic      Multiple  0   Live Births  1          Past Medical History:  Diagnosis Date  . Rheumatic fever    as a child  Hx of : Narcolepsy Past Surgical History:  Procedure Laterality Date  . TONSILLECTOMY    . WISDOM TOOTH EXTRACTION     Family History: family history is not on file. Social History:  reports that she has never smoked. She has never used smokeless tobacco. She reports that she does not drink alcohol or use drugs.     Maternal Diabetes: No Genetic Screening: Normal Maternal Ultrasounds/Referrals: Normal Fetal Ultrasounds or other Referrals:  None Maternal Substance Abuse:  No Significant Maternal Medications:  None Significant Maternal Lab Results:  Lab values include: Group B Strep negative Other Comments:  None  Review of Systems  Gastrointestinal:       Contractions  All other systems reviewed and are negative.  Maternal Medical History:  Reason for admission: Contractions.   Contractions: Onset was 3-5 hours ago.   Frequency: regular.   Perceived severity is moderate.    Fetal activity: Perceived fetal activity is normal.   Last perceived fetal movement was within the past hour.    Prenatal complications: no prenatal complications Prenatal Complications - Diabetes: none.    Dilation: 5.5 Effacement (%): 70 Station: -2 Exam by:: Ginnie Smartachel Schmidt RN  Blood pressure 126/60, pulse 88, temperature 97.7 F (36.5 C), temperature source Oral, resp. rate 20, height 5\' 6"  (1.676 m), weight 189 lb (85.7 kg), SpO2 99 %, unknown if currently breastfeeding. Maternal Exam:  Uterine Assessment: Contraction strength is moderate.  Contraction frequency is regular.   Abdomen: Patient reports no abdominal  tenderness. Fetal presentation: vertex  Introitus: Normal vulva. Normal vagina.  Pelvis: adequate for delivery.   Cervix: Cervix evaluated by digital exam.     Fetal Exam Fetal Monitor Review: Mode: ultrasound.   Variability: moderate (6-25 bpm).   Pattern: accelerations present and no decelerations.    Fetal State Assessment: Category I - tracings are normal.     Physical Exam  Nursing note and vitals reviewed. Constitutional: She is oriented to person, place, and time. She appears well-developed and well-nourished.  HENT:  Head: Normocephalic and atraumatic.  Cardiovascular: Normal rate and regular rhythm.  Respiratory: Effort normal and breath sounds normal.  GI: Soft.  Genitourinary: Vagina normal.  Musculoskeletal: Normal range of motion.  Neurological: She is alert and oriented to person, place, and time.  Skin: Skin is warm and dry.  Psychiatric: She has a normal mood and affect. Her behavior is normal.    Prenatal labs: ABO, Rh:  AB Positive Antibody:  Neg Rubella:  Non- Immune RPR:   NR HBsAg:   Neg HIV:   neg GBS: Negative (07/17 1537)   Assessment/Plan: Term pregnancy GBS negative Multigravida  Admit to L/D Routine [facility] orders Expectant management Anticipate vaginal Delivery    Lori A Clemmons CNM 09/08/2017, 5:14 PM

## 2017-09-08 NOTE — MAU Note (Signed)
Pt. States consistent ctx began around 1300 and are 4-5 min apart.  Pt. Denies bleeding or LOF.  Pt. Reports good fetal movement

## 2017-09-08 NOTE — MAU Note (Signed)
Urine sent to lab 

## 2017-09-08 NOTE — Anesthesia Preprocedure Evaluation (Signed)
Anesthesia Evaluation  Patient identified by MRN, date of birth, ID band Patient awake    Reviewed: Allergy & Precautions, NPO status , Patient's Chart, lab work & pertinent test results  Airway Mallampati: II  TM Distance: >3 FB Neck ROM: Full    Dental no notable dental hx. (+) Teeth Intact   Pulmonary neg pulmonary ROS,    Pulmonary exam normal breath sounds clear to auscultation       Cardiovascular negative cardio ROS Normal cardiovascular exam Rhythm:Regular Rate:Normal     Neuro/Psych negative neurological ROS  negative psych ROS   GI/Hepatic negative GI ROS,   Endo/Other    Renal/GU      Musculoskeletal   Abdominal   Peds  Hematology  (+) anemia ,   Anesthesia Other Findings   Reproductive/Obstetrics (+) Pregnancy                             Lab Results  Component Value Date   WBC 12.4 (H) 09/08/2017   HGB 10.6 (L) 09/08/2017   HCT 33.1 (L) 09/08/2017   MCV 81.9 09/08/2017   PLT 165 09/08/2017    Anesthesia Physical Anesthesia Plan  ASA: II  Anesthesia Plan: Epidural   Post-op Pain Management:    Induction:   PONV Risk Score and Plan:   Airway Management Planned:   Additional Equipment:   Intra-op Plan:   Post-operative Plan:   Informed Consent: I have reviewed the patients History and Physical, chart, labs and discussed the procedure including the risks, benefits and alternatives for the proposed anesthesia with the patient or authorized representative who has indicated his/her understanding and acceptance.     Plan Discussed with:   Anesthesia Plan Comments:         Anesthesia Quick Evaluation

## 2017-09-08 NOTE — Anesthesia Procedure Notes (Signed)
Epidural Patient location during procedure: OB Start time: 09/08/2017 6:25 PM End time: 09/08/2017 6:41 PM  Staffing Anesthesiologist: Trevor IhaHouser, Vear Staton A, MD Performed: anesthesiologist   Preanesthetic Checklist Completed: patient identified, site marked, surgical consent, pre-op evaluation, timeout performed, IV checked, risks and benefits discussed and monitors and equipment checked  Epidural Patient position: sitting Prep: site prepped and draped and DuraPrep Patient monitoring: continuous pulse ox and blood pressure Approach: midline Location: L3-L4 Injection technique: LOR air  Needle:  Needle type: Tuohy  Needle gauge: 17 G Needle length: 9 cm and 9 Needle insertion depth: 5 cm cm Catheter type: closed end flexible Catheter size: 19 Gauge Catheter at skin depth: 10 cm Test dose: negative  Assessment Events: blood not aspirated, injection not painful, no injection resistance, negative IV test and no paresthesia

## 2017-09-08 NOTE — MAU Note (Signed)
Pt. Had some bloody show before transfer to birthing suites.  RN checked bleeding with charge RN

## 2017-09-09 LAB — CBC
HCT: 30.2 % — ABNORMAL LOW (ref 36.0–46.0)
HEMOGLOBIN: 9.9 g/dL — AB (ref 12.0–15.0)
MCH: 26.7 pg (ref 26.0–34.0)
MCHC: 32.8 g/dL (ref 30.0–36.0)
MCV: 81.4 fL (ref 78.0–100.0)
Platelets: 145 10*3/uL — ABNORMAL LOW (ref 150–400)
RBC: 3.71 MIL/uL — AB (ref 3.87–5.11)
RDW: 14.1 % (ref 11.5–15.5)
WBC: 12 10*3/uL — ABNORMAL HIGH (ref 4.0–10.5)

## 2017-09-09 LAB — RPR: RPR Ser Ql: NONREACTIVE

## 2017-09-09 NOTE — Lactation Note (Signed)
This note was copied from a baby's chart. Lactation Consultation Note  Patient Name: Boy Nita SellsCourtney Wolven ZOXWR'UToday's Date: 09/09/2017 Reason for consult: Initial assessment;Early term 37-38.6wks Breastfeeding consultation services and support information given and reviewed.  This is mom's second baby and she breastfed her first 16 months.  Baby is 15 hours old and has had several feeds.  Mom reports baby is feeding very well.  Instructed to watch for cues and feed baby .  Encouraged to call for assist prn.  Maternal Data Has patient been taught Hand Expression?: Yes Does the patient have breastfeeding experience prior to this delivery?: Yes  Feeding Feeding Type: Breast Fed Length of feed: 10 min  LATCH Score                   Interventions    Lactation Tools Discussed/Used     Consult Status Consult Status: Follow-up Date: 09/10/17 Follow-up type: In-patient    Huston FoleyMOULDEN, Jeven Topper S 09/09/2017, 11:52 AM

## 2017-09-09 NOTE — Progress Notes (Signed)
Post Partum Day 1 Subjective: no complaints  Objective: Blood pressure 111/64, pulse 68, temperature 98.1 F (36.7 C), temperature source Oral, resp. rate 16, height 5\' 6"  (1.676 m), weight 189 lb (85.7 kg), SpO2 98 %, unknown if currently breastfeeding.  Physical Exam:  General: alert, cooperative and appears stated age Lochia: appropriate Uterine Fundus: firm Incision:  DVT Evaluation: No evidence of DVT seen on physical exam.  Recent Labs    09/08/17 1745 09/09/17 0548  HGB 10.6* 9.9*  HCT 33.1* 30.2*    Assessment/Plan: Plan for discharge tomorrow, Breastfeeding and Circumcision prior to dischargeCirc done today   LOS: 1 day   Erin FiscalLori A Garner 09/09/2017, 3:56 PM

## 2017-09-09 NOTE — Anesthesia Postprocedure Evaluation (Signed)
Anesthesia Post Note  Patient: Erin Garner  Procedure(s) Performed: AN AD HOC LABOR EPIDURAL     Patient location during evaluation: Mother Baby Anesthesia Type: Epidural Level of consciousness: awake Pain management: satisfactory to patient Vital Signs Assessment: post-procedure vital signs reviewed and stable Respiratory status: spontaneous breathing Cardiovascular status: stable Anesthetic complications: no    Last Vitals:  Vitals:   09/08/17 2356 09/09/17 0517  BP: 126/63 117/66  Pulse: (!) 105 80  Resp: 16 16  Temp: 36.8 C 36.9 C  SpO2: 98%     Last Pain:  Vitals:   09/09/17 0517  TempSrc: Oral  PainSc: 0-No pain   Pain Goal: Patients Stated Pain Goal: 9 (09/08/17 1748)               Cephus ShellingBURGER,Brayln Duque

## 2017-09-10 MED ORDER — IBUPROFEN 600 MG PO TABS: 600.0000 mg | ORAL_TABLET | Freq: Four times a day (QID) | ORAL | 0 refills | Status: DC

## 2017-09-10 NOTE — Discharge Summary (Signed)
SVD OB Discharge Summary     Patient Name: Erin Garner DOB: 12/21/1985 MRN: [id number]  Date of admission: 09/08/2017 Delivering MD: Kathalene FramesGREER, ELLIS K  Date of delivery: 09/08/2017 Type of delivery: SVD  Newborn Data: Sex: BB Circumcision: Done Live born female  Birth Weight: 7 lb 9 oz (3430 g) APGAR: 9, 10  Newborn Delivery   Birth date/time:  09/08/2017 20:47:00 Delivery type:  Vaginal, Spontaneous     Feeding: breast Infant being discharge to home with mother in stable condition.   Admitting diagnosis: LABOR Intrauterine pregnancy: 4041w5d     Secondary diagnosis:  Active Problems:   Indication for care in labor or delivery                                Complications: None                                                              Intrapartum Procedures: spontaneous vaginal delivery Postpartum Procedures: none Complications-Operative and Postpartum: Periturthral tear with reapir and anemia Augmentation: AROM, clear    History of Present Illness: [name] is a 32 y.o. female, G2P2002, who presents at 1641w5d weeks gestation. [facility]  Her pregnancy has been complicated by: Patient Active Problem List   Diagnosis Date Noted  . Indication for care in labor or delivery 09/08/2017  . Preterm uterine contractions 08/17/2017  . Vaginal delivery 10/29/2014  . Positive GBS test 10/29/2014   Hospital course:  Onset of Labor With Vaginal Delivery     32 y.o. yo [id number] at 10941w5d was admitted in Active Labor on 09/08/2017. Patient had an uncomplicated labor course as follows:  Membrane Rupture Time/Date: 7:50 PM ,09/08/2017   Intrapartum Procedures: Episiotomy: None [1]                                         Lacerations:  Periurethral [8]  Patient had a delivery of a Viable infant. 09/08/2017  Information for the patient's newborn:  Beverly GustGuadagno, Boy Samarah [[id number]][[id number]]  Delivery Method:  Vaginal, Spontaneous(Filed from Delivery Summary)    Pateint had an uncomplicated postpartum course.  She is ambulating, tolerating a regular diet, passing flatus, and urinating well. Patient is discharged home in stable condition on 09/10/17.  Postpartum Day # 2 : S/P NSVD due to spontaneous labor. Patient up ad lib, denies syncope or dizziness. Reports consuming regular diet without issues and denies N/V. Patient reports 0 bowel movement + passing flatus.  Denies issues with urination and reports bleeding is "light."  Patient is Breastfeeding and reports going well.  Desires possibly nexplanon for postpartum contraception.  Pain is being appropriately managed with use of motrin and tylenol. Pt left in room breastfeeding infant with father at bedside, bonding well.  Physical exam  Vitals:   09/09/17 0845 09/09/17 1152 09/09/17 2135 09/10/17 0547  BP: 107/63 111/64 113/66 112/64  Pulse: 69 68 68 65  Resp: 18 16 16 16   Temp: 97.9 F (36.6 C) 98.1 F (36.7 C) 97.6 F (36.4 C) 97.8 F (36.6  C)  TempSrc: Oral Oral Oral Oral  SpO2:   100% 99%  Weight:      Height:       General: alert, cooperative and no distress Lochia: appropriate Uterine Fundus: firm Perineum: approximate periurethral repairs, hemostatic, no erythema, mild tenderness.  DVT Evaluation: No evidence of DVT seen on physical exam. Negative Homan's sign. No cords or calf tenderness. No significant calf/ankle edema.  Labs: Lab Results  Component Value Date   WBC 12.0 (H) 09/09/2017   HGB 9.9 (L) 09/09/2017   HCT 30.2 (L) 09/09/2017   MCV 81.4 09/09/2017   PLT 145 (L) 09/09/2017   No flowsheet data found.  Date of discharge: 09/10/2017 Discharge Diagnoses: Term Pregnancy-delivered and anemia.  Discharge instruction: per After Visit Summary and "Baby and Me Booklet".  After visit meds:  Allergies as of 09/10/2017      Reactions   Sulfa Antibiotics Hives      Medication List    STOP taking these medications    NIFEdipine 10 MG capsule Commonly known as:  PROCARDIA     TAKE these medications   acetaminophen 500 MG tablet Commonly known as:  TYLENOL Take 500 mg by mouth every 6 (six) hours as needed for mild pain or headache.   calcium carbonate 500 MG chewable tablet Commonly known as:  TUMS - dosed in mg elemental calcium Chew 1 tablet by mouth 2 (two) times daily as needed for indigestion or heartburn.   cetirizine 10 MG tablet Commonly known as:  ZYRTEC Take 10 mg by mouth daily as needed for allergies or rhinitis.   cromolyn 5.2 MG/ACT nasal spray Commonly known as:  NASALCROM Place 1 spray into both nostrils 4 (four) times daily.   ferrous sulfate 325 (65 FE) MG tablet Take 325 mg by mouth daily with breakfast.   ibuprofen 600 MG tablet Commonly known as:  ADVIL,MOTRIN Take 1 tablet (600 mg total) by mouth every 6 (six) hours.   prenatal multivitamin Tabs tablet Take 1 tablet by mouth daily at 12 noon.       Activity:           pelvic rest Advance as tolerated. Pelvic rest for 6 weeks.  Diet:                routine Medications: PNV, Ibuprofen, Colace and Iron Postpartum contraception: Nexplanon Condition:  Pt discharge to home with baby in stable Anemia: Take PO iron   Meds: Allergies as of 09/10/2017      Reactions   Sulfa Antibiotics Hives      Medication List    STOP taking these medications   NIFEdipine 10 MG capsule Commonly known as:  PROCARDIA     TAKE these medications   acetaminophen 500 MG tablet Commonly known as:  TYLENOL Take 500 mg by mouth every 6 (six) hours as needed for mild pain or headache.   calcium carbonate 500 MG chewable tablet Commonly known as:  TUMS - dosed in mg elemental calcium Chew 1 tablet by mouth 2 (two) times daily as needed for indigestion or heartburn.   cetirizine 10 MG tablet Commonly known as:  ZYRTEC Take 10 mg by mouth daily as needed for allergies or rhinitis.   cromolyn 5.2 MG/ACT nasal spray Commonly  known as:  NASALCROM Place 1 spray into both nostrils 4 (four) times daily.   ferrous sulfate 325 (65 FE) MG tablet Take 325 mg by mouth daily with breakfast.   ibuprofen 600 MG tablet Commonly known  as:  ADVIL,MOTRIN Take 1 tablet (600 mg total) by mouth every 6 (six) hours.   prenatal multivitamin Tabs tablet Take 1 tablet by mouth daily at 12 noon.       Discharge Follow Up:  Follow-up Information    [facility] Follow up in 6 week(s).   Specialty:  Obstetrics and Gynecology Contact information: [address] [place] [address] [phone]           [place], NP-C, CNM 09/10/2017, 8:16 AM  Dale [place], FNP

## 2017-09-10 NOTE — Lactation Note (Signed)
This note was copied from a baby's chart. Lactation Consultation Note  Patient Name: Erin Garner ZOXWR'UToday's Date: 09/10/2017 Reason for consult: Follow-up assessment Mom reports that baby is feeding very well.  Cluster fed last night.  Mom feels milk is in this morning.  Breasts are comfortable.  Lactation outpatient services and support information reviewed and encouraged prn.  Maternal Data    Feeding Feeding Type: Breast Fed  LATCH Score Latch: Grasps breast easily, tongue down, lips flanged, rhythmical sucking.  Audible Swallowing: A few with stimulation  Type of Nipple: Everted at rest and after stimulation  Comfort (Breast/Nipple): Soft / non-tender  Hold (Positioning): No assistance needed to correctly position infant at breast.  LATCH Score: 9  Interventions    Lactation Tools Discussed/Used     Consult Status Consult Status: Complete Follow-up type: Call as needed    Huston FoleyMOULDEN, [name] 09/10/2017, 9:14 AM

## 2017-11-25 ENCOUNTER — Ambulatory Visit
Admission: RE | Admit: 2017-11-25 | Discharge: 2017-11-25 | Disposition: A | Discharge status: Home / Self Care | Payer: 59 | Source: Ambulatory Visit | Attending: Obstetrics and Gynecology | Admitting: Obstetrics and Gynecology

## 2017-11-25 ENCOUNTER — Other Ambulatory Visit: Payer: Self-pay | Admitting: Obstetrics and Gynecology

## 2017-11-25 DIAGNOSIS — T8332XA Displacement of intrauterine contraceptive device, initial encounter: Secondary | ICD-10-CM

## 2017-11-26 ENCOUNTER — Other Ambulatory Visit: Payer: Self-pay

## 2017-11-26 ENCOUNTER — Encounter (HOSPITAL_BASED_OUTPATIENT_CLINIC_OR_DEPARTMENT_OTHER): Payer: Self-pay

## 2017-11-26 NOTE — Progress Notes (Signed)
Spoke with:  Toni Amend NPO:  No food after midnight/Clear liquids until 7:00AM  DOS Arrival time: 11:00AM Labs: UPT AM medications: None Pre op orders: Needs second sign Ride home:  Jorja Loa (husband) [phone]

## 2017-11-29 ENCOUNTER — Encounter (HOSPITAL_BASED_OUTPATIENT_CLINIC_OR_DEPARTMENT_OTHER)
Admission: RE | Disposition: A | Discharge status: Home / Self Care | Payer: Self-pay | Source: Ambulatory Visit | Attending: Obstetrics and Gynecology

## 2017-11-29 ENCOUNTER — Ambulatory Visit (HOSPITAL_BASED_OUTPATIENT_CLINIC_OR_DEPARTMENT_OTHER): Payer: 59 | Admitting: Anesthesiology

## 2017-11-29 ENCOUNTER — Encounter (HOSPITAL_BASED_OUTPATIENT_CLINIC_OR_DEPARTMENT_OTHER): Payer: Self-pay

## 2017-11-29 ENCOUNTER — Ambulatory Visit (HOSPITAL_BASED_OUTPATIENT_CLINIC_OR_DEPARTMENT_OTHER)
Admission: RE | Admit: 2017-11-29 | Discharge: 2017-11-29 | Disposition: A | Discharge status: Home / Self Care | Payer: 59 | Source: Ambulatory Visit | Attending: Obstetrics and Gynecology | Admitting: Obstetrics and Gynecology

## 2017-11-29 DIAGNOSIS — S30850A Superficial foreign body of lower back and pelvis, initial encounter: Secondary | ICD-10-CM

## 2017-11-29 DIAGNOSIS — Y838 Other surgical procedures as the cause of abnormal reaction of the patient, or of later complication, without mention of misadventure at the time of the procedure: Secondary | ICD-10-CM | POA: Insufficient documentation

## 2017-11-29 DIAGNOSIS — T8339XA Other mechanical complication of intrauterine contraceptive device, initial encounter: Secondary | ICD-10-CM | POA: Diagnosis not present

## 2017-11-29 DIAGNOSIS — D649 Anemia, unspecified: Secondary | ICD-10-CM | POA: Insufficient documentation

## 2017-11-29 DIAGNOSIS — T8332XA Displacement of intrauterine contraceptive device, initial encounter: Secondary | ICD-10-CM

## 2017-11-29 DIAGNOSIS — T8389XA Other specified complication of genitourinary prosthetic devices, implants and grafts, initial encounter: Secondary | ICD-10-CM

## 2017-11-29 HISTORY — DX: Anemia, unspecified: D64.9

## 2017-11-29 HISTORY — DX: Other seasonal allergic rhinitis: J30.2

## 2017-11-29 HISTORY — DX: Narcolepsy without cataplexy: G47.419

## 2017-11-29 HISTORY — DX: Gastro-esophageal reflux disease without esophagitis: K21.9

## 2017-11-29 HISTORY — PX: LAPAROSCOPY: SHX197

## 2017-11-29 HISTORY — PX: DILATATION & CURRETTAGE/HYSTEROSCOPY WITH RESECTOCOPE: SHX5572

## 2017-11-29 LAB — POCT PREGNANCY, URINE: Preg Test, Ur: NEGATIVE

## 2017-11-29 SURGERY — DILATATION & CURETTAGE/HYSTEROSCOPY WITH RESECTOCOPE
Anesthesia: General

## 2017-11-29 MED ORDER — DEXAMETHASONE SODIUM PHOSPHATE 10 MG/ML IJ SOLN
INTRAMUSCULAR | Status: DC | PRN
  Administered 2017-11-29: 5 mg via INTRAVENOUS

## 2017-11-29 MED ORDER — IBUPROFEN 600 MG PO TABS: ORAL_TABLET | ORAL | 0 refills | Status: AC

## 2017-11-29 MED ORDER — LIDOCAINE HCL (CARDIAC) PF 100 MG/5ML IV SOSY
PREFILLED_SYRINGE | INTRAVENOUS | Status: DC | PRN
  Administered 2017-11-29: 100 mg via INTRAVENOUS

## 2017-11-29 MED ORDER — ONDANSETRON HCL 4 MG/2ML IJ SOLN
INTRAMUSCULAR | Status: AC
  Filled 2017-11-29: qty 2

## 2017-11-29 MED ORDER — ONDANSETRON HCL 4 MG/2ML IJ SOLN
4.0000 mg | Freq: Four times a day (QID) | INTRAMUSCULAR | Status: DC | PRN
  Filled 2017-11-29: qty 2

## 2017-11-29 MED ORDER — OXYCODONE-ACETAMINOPHEN 5-325 MG PO TABS: ORAL_TABLET | ORAL | 0 refills | Status: AC

## 2017-11-29 MED ORDER — PROPOFOL 10 MG/ML IV BOLUS
INTRAVENOUS | Status: DC | PRN
  Administered 2017-11-29: 150 mg via INTRAVENOUS

## 2017-11-29 MED ORDER — MIDAZOLAM HCL 2 MG/2ML IJ SOLN
INTRAMUSCULAR | Status: AC
  Filled 2017-11-29: qty 2

## 2017-11-29 MED ORDER — OXYCODONE HCL 5 MG/5ML PO SOLN
5.0000 mg | Freq: Once | ORAL | Status: DC | PRN
  Filled 2017-11-29: qty 5

## 2017-11-29 MED ORDER — FENTANYL CITRATE (PF) 100 MCG/2ML IJ SOLN
INTRAMUSCULAR | Status: DC | PRN
  Administered 2017-11-29: 100 ug via INTRAVENOUS
  Administered 2017-11-29: 50 ug via INTRAVENOUS
  Administered 2017-11-29: 100 ug via INTRAVENOUS

## 2017-11-29 MED ORDER — SUCCINYLCHOLINE CHLORIDE 200 MG/10ML IV SOSY
PREFILLED_SYRINGE | INTRAVENOUS | Status: AC
  Filled 2017-11-29: qty 10

## 2017-11-29 MED ORDER — GLYCOPYRROLATE 0.2 MG/ML IJ SOLN
INTRAMUSCULAR | Status: DC | PRN
  Administered 2017-11-29: 0.2 mg via INTRAVENOUS

## 2017-11-29 MED ORDER — NORETHINDRONE 0.35 MG PO TABS: 1.0000 | ORAL_TABLET | Freq: Every day | ORAL | 11 refills | Status: AC

## 2017-11-29 MED ORDER — OXYCODONE HCL 5 MG PO TABS
5.0000 mg | ORAL_TABLET | Freq: Once | ORAL | Status: DC | PRN
  Filled 2017-11-29: qty 1

## 2017-11-29 MED ORDER — SUGAMMADEX SODIUM 200 MG/2ML IV SOLN
INTRAVENOUS | Status: DC | PRN
  Administered 2017-11-29: 200 mg via INTRAVENOUS

## 2017-11-29 MED ORDER — MIDAZOLAM HCL 5 MG/5ML IJ SOLN
INTRAMUSCULAR | Status: DC | PRN
  Administered 2017-11-29: 2 mg via INTRAVENOUS

## 2017-11-29 MED ORDER — SODIUM CHLORIDE 0.9 % IR SOLN
Status: DC | PRN
  Administered 2017-11-29: 3000 mL

## 2017-11-29 MED ORDER — BUPIVACAINE-EPINEPHRINE 0.25% -1:200000 IJ SOLN
INTRAMUSCULAR | Status: DC | PRN
  Administered 2017-11-29: 10 mL

## 2017-11-29 MED ORDER — LACTATED RINGERS IV SOLN
INTRAVENOUS | Status: DC
  Administered 2017-11-29 (×2): via INTRAVENOUS
  Filled 2017-11-29: qty 1000

## 2017-11-29 MED ORDER — DEXAMETHASONE SODIUM PHOSPHATE 10 MG/ML IJ SOLN
INTRAMUSCULAR | Status: AC
  Filled 2017-11-29: qty 1

## 2017-11-29 MED ORDER — ONDANSETRON HCL 4 MG/2ML IJ SOLN
INTRAMUSCULAR | Status: DC | PRN
  Administered 2017-11-29: 4 mg via INTRAVENOUS

## 2017-11-29 MED ORDER — FENTANYL CITRATE (PF) 250 MCG/5ML IJ SOLN
INTRAMUSCULAR | Status: AC
  Filled 2017-11-29: qty 5

## 2017-11-29 MED ORDER — ROCURONIUM BROMIDE 100 MG/10ML IV SOLN
INTRAVENOUS | Status: AC
  Filled 2017-11-29: qty 1

## 2017-11-29 MED ORDER — ROCURONIUM BROMIDE 100 MG/10ML IV SOLN
INTRAVENOUS | Status: DC | PRN
  Administered 2017-11-29: 20 mg via INTRAVENOUS

## 2017-11-29 MED ORDER — SUCCINYLCHOLINE CHLORIDE 200 MG/10ML IV SOSY
PREFILLED_SYRINGE | INTRAVENOUS | Status: DC | PRN
  Administered 2017-11-29: 120 mg via INTRAVENOUS

## 2017-11-29 MED ORDER — FENTANYL CITRATE (PF) 100 MCG/2ML IJ SOLN
INTRAMUSCULAR | Status: AC
  Filled 2017-11-29: qty 2

## 2017-11-29 MED ORDER — LIDOCAINE HCL (CARDIAC) PF 100 MG/5ML IV SOSY
PREFILLED_SYRINGE | INTRAVENOUS | Status: AC
  Filled 2017-11-29: qty 5

## 2017-11-29 MED ORDER — FENTANYL CITRATE (PF) 100 MCG/2ML IJ SOLN
25.0000 ug | INTRAMUSCULAR | Status: DC | PRN
  Administered 2017-11-29: 25 ug via INTRAVENOUS
  Filled 2017-11-29: qty 1

## 2017-11-29 SURGICAL SUPPLY — 47 items
BARRIER ADHS 3X4 INTERCEED (GAUZE/BANDAGES/DRESSINGS) IMPLANT
BIPOLAR CUTTING LOOP 21FR (ELECTRODE)
CABLE HIGH FREQUENCY MONO STRZ (ELECTRODE) IMPLANT
CANISTER SUCT 3000ML PPV (MISCELLANEOUS) ×3 IMPLANT
CATH ROBINSON RED A/P 16FR (CATHETERS) ×3 IMPLANT
DERMABOND ADVANCED (GAUZE/BANDAGES/DRESSINGS) ×2
DERMABOND ADVANCED .7 DNX12 (GAUZE/BANDAGES/DRESSINGS) ×1 IMPLANT
DILATOR CANAL MILEX (MISCELLANEOUS) IMPLANT
DRSG OPSITE POSTOP 3X4 (GAUZE/BANDAGES/DRESSINGS) IMPLANT
DRSG VASELINE 3X18 (GAUZE/BANDAGES/DRESSINGS) IMPLANT
DURAPREP 26ML APPLICATOR (WOUND CARE) ×3 IMPLANT
ELECT REM PT RETURN 9FT ADLT (ELECTROSURGICAL) ×3
ELECTRODE REM PT RTRN 9FT ADLT (ELECTROSURGICAL) ×1 IMPLANT
FORCEPS CUTTING 33CM 5MM (CUTTING FORCEPS) IMPLANT
FORCEPS CUTTING 45CM 5MM (CUTTING FORCEPS) IMPLANT
GAUZE 4X4 16PLY RFD (DISPOSABLE) ×3 IMPLANT
GLOVE BIO SURGEON STRL SZ 6.5 (GLOVE) ×2 IMPLANT
GLOVE BIO SURGEONS STRL SZ 6.5 (GLOVE) ×1
GLOVE BIOGEL PI IND STRL 7.0 (GLOVE) ×3 IMPLANT
GLOVE BIOGEL PI INDICATOR 7.0 (GLOVE) ×6
GOWN STRL REUS W/TWL LRG LVL3 (GOWN DISPOSABLE) ×6 IMPLANT
KIT PROCEDURE FLUENT (KITS) ×3 IMPLANT
LOOP CUTTING BIPOLAR 21FR (ELECTRODE) IMPLANT
MANIPULATOR UTERINE 4.5 ZUMI (MISCELLANEOUS) IMPLANT
NS IRRIG 1000ML POUR BTL (IV SOLUTION) ×3 IMPLANT
PACK LAPAROSCOPY BASIN (CUSTOM PROCEDURE TRAY) ×3 IMPLANT
PACK TRENDGUARD 450 HYBRID PRO (MISCELLANEOUS) IMPLANT
PACK VAGINAL MINOR WOMEN LF (CUSTOM PROCEDURE TRAY) ×3 IMPLANT
PAD OB MATERNITY 4.3X12.25 (PERSONAL CARE ITEMS) ×3 IMPLANT
POUCH SPECIMEN RETRIEVAL 10MM (ENDOMECHANICALS) IMPLANT
PROTECTOR NERVE ULNAR (MISCELLANEOUS) ×6 IMPLANT
SET IRRIG TUBING LAPAROSCOPIC (IRRIGATION / IRRIGATOR) IMPLANT
SHEARS HARMONIC ACE PLUS 36CM (ENDOMECHANICALS) IMPLANT
SLEEVE XCEL OPT CAN 5 100 (ENDOMECHANICALS) ×3 IMPLANT
SOLUTION ELECTROLUBE (MISCELLANEOUS) IMPLANT
SUT MNCRL AB 3-0 PS2 27 (SUTURE) ×3 IMPLANT
SUT VICRYL 0 ENDOLOOP (SUTURE) IMPLANT
SUT VICRYL 0 UR6 27IN ABS (SUTURE) ×6 IMPLANT
SYR 20CC LL (SYRINGE) ×3 IMPLANT
SYR 50ML SLIP (SYRINGE) ×3 IMPLANT
TOWEL OR 17X24 6PK STRL BLUE (TOWEL DISPOSABLE) ×6 IMPLANT
TRAY FOLEY W/BAG SLVR 14FR (SET/KITS/TRAYS/PACK) ×3 IMPLANT
TRENDGUARD 450 HYBRID PRO PACK (MISCELLANEOUS)
TROCAR BALLN 12MMX100 BLUNT (TROCAR) ×3 IMPLANT
TROCAR XCEL NON-BLD 5MMX100MML (ENDOMECHANICALS) ×3 IMPLANT
TUBING INSUF HEATED (TUBING) ×3 IMPLANT
WARMER LAPAROSCOPE (MISCELLANEOUS) ×3 IMPLANT

## 2017-11-29 NOTE — H&P (Signed)
Erin Garner is an 32 y.o. female. Who comes in to retrieve a misplaced IUD.  Pt seen in the office last week for FU and the strings could not be found.  US demonstrated no IUD.  X Ray showed IUD in the pelvis.  Pt denied having any pain, fevers or chills  Pertinent Gynecological History: Menses: pt BF no menses Bleeding: na Contraception: IUD Sexually transmitted diseases: no past history Previous GYN Procedures: na  Last mammogram: na Date: na Last pap: normal Date: 2018 OB History: G2, P2   Menstrual History: Menarche age: 69 No LMP recorded.    Past Medical History:  Diagnosis Date  . Anemia   . GERD (gastroesophageal reflux disease)    with pregnancy  . Narcolepsy   . Rheumatic fever    as a child  . Seasonal allergies     Past Surgical History:  Procedure Laterality Date  . TONSILLECTOMY    . WISDOM TOOTH EXTRACTION      History reviewed. No pertinent family history.  Social History:  reports that she has never smoked. She has never used smokeless tobacco. She reports that she drinks alcohol. She reports that she does not use drugs.  Allergies:  Allergies  Allergen Reactions  . Sulfa Antibiotics Hives    Medications Prior to Admission  Medication Sig Dispense Refill Last Dose  . acetaminophen (TYLENOL) 500 MG tablet Take 500 mg by mouth every 6 (six) hours as needed for mild pain or headache.    Past Month at Unknown time  . calcium carbonate (TUMS - DOSED IN MG ELEMENTAL CALCIUM) 500 MG chewable tablet Chew 1 tablet by mouth 2 (two) times daily as needed for indigestion or heartburn.   11/28/2017 at Unknown time  . ferrous sulfate 325 (65 FE) MG tablet Take 325 mg by mouth daily with breakfast.   Past Week at Unknown time  . ibuprofen (ADVIL,MOTRIN) 600 MG tablet Take 1 tablet (600 mg total) by mouth every 6 (six) hours. (Patient taking differently: Take 600 mg by mouth every 6 (six) hours as needed. ) 30 tablet 0 Past Week at Unknown time  . Prenatal  Vit-Fe Fumarate-FA (PRENATAL MULTIVITAMIN) TABS tablet Take 1 tablet by mouth daily at 12 noon.   11/28/2017 at Unknown time  . cetirizine (ZYRTEC) 10 MG tablet Take 10 mg by mouth daily as needed for allergies or rhinitis.    11/27/2017  . cromolyn (NASALCROM) 5.2 MG/ACT nasal spray Place 1 spray into both nostrils 4 (four) times daily. (Patient not taking: Reported on 09/08/2017) 26 mL 12 Not Taking at Unknown time    ROS  Blood pressure 114/69, pulse 60, temperature (!) 97.2 F (36.2 C), temperature source Oral, resp. rate 16, height 5\' 6"  (1.676 m), weight 75.2 kg, SpO2 100 %, unknown if currently breastfeeding. Physical Exam  Physical Examination: General appearance - alert, well appearing, and in no distress Chest - clear to auscultation, no wheezes, rales or rhonchi, symmetric air entry Heart - normal rate and regular rhythm Abdomen - soft, nontender, nondistended, no masses or organomegaly Pelvic - normal external genitalia, vulva, vagina, cervix, uterus and adnexa Extremities - peripheral pulses normal, no pedal edema, no clubbing or cyanosis, Homan's sign negative bilaterally  Results for orders placed or performed during the hospital encounter of 11/29/17 (from the past 24 hour(s))  Pregnancy, urine POC     Status: None   Collection Time: 11/29/17 11:38 AM  Result Value Ref Range   Preg Test, Ur NEGATIVE  NEGATIVE    No results found.  Assessment/Plan: Retained IUD Will start with hysteroscopy and then L/S if not found in the uterus.   Pt understands the risks are bleeding, infection, damage to internal organs like bowel and bladder She desires micronor for [facility]  [facility] A 11/29/2017, 1:13 PM

## 2017-11-29 NOTE — Anesthesia Procedure Notes (Signed)
Procedure Name: Intubation Date/Time: 11/29/2017 1:51 PM Performed by: Jonna Munro, CRNA Pre-anesthesia Checklist: Patient identified, Emergency Drugs available, Suction available, Patient being monitored and Timeout performed Patient Re-evaluated:Patient Re-evaluated prior to induction Oxygen Delivery Method: Circle system utilized Preoxygenation: Pre-oxygenation with 100% oxygen Induction Type: IV induction Ventilation: Mask ventilation without difficulty Laryngoscope Size: Mac and 3 Grade View: Grade I Tube type: Oral Tube size: 7.0 mm Number of attempts: 1 Airway Equipment and Method: Stylet Placement Confirmation: ETT inserted through vocal cords under direct vision,  positive ETCO2 and breath sounds checked- equal and bilateral Secured at: 22 cm Tube secured with: Tape Dental Injury: Teeth and Oropharynx as per pre-operative assessment

## 2017-11-29 NOTE — Anesthesia Preprocedure Evaluation (Signed)
Anesthesia Evaluation  Patient identified by MRN, date of birth, ID band Patient awake    Reviewed: Allergy & Precautions, H&P , NPO status , Patient's Chart, lab work & pertinent test results  Airway Mallampati: II   Neck ROM: full    Dental   Pulmonary neg pulmonary ROS,    breath sounds clear to auscultation       Cardiovascular negative cardio ROS   Rhythm:regular Rate:Normal     Neuro/Psych    GI/Hepatic GERD  ,  Endo/Other    Renal/GU      Musculoskeletal   Abdominal   Peds  Hematology   Anesthesia Other Findings   Reproductive/Obstetrics Displaced IUD                             Anesthesia Physical Anesthesia Plan  ASA: II  Anesthesia Plan: General   Post-op Pain Management:    Induction: Intravenous  PONV Risk Score and Plan: 3 and Ondansetron, Dexamethasone and Midazolam  Airway Management Planned: Oral ETT  Additional Equipment:   Intra-op Plan:   Post-operative Plan: Extubation in OR  Informed Consent: I have reviewed the patients History and Physical, chart, labs and discussed the procedure including the risks, benefits and alternatives for the proposed anesthesia with the patient or authorized representative who has indicated his/her understanding and acceptance.     Plan Discussed with: CRNA and Anesthesiologist  Anesthesia Plan Comments:         Anesthesia Quick Evaluation

## 2017-11-29 NOTE — Discharge Instructions (Signed)
Call [facility] @ [phone] if:  You have a temperature greater than or equal to 100.4 degrees Farenheit orally You have pain that is not made better by the pain medication given and taken as directed You have excessive bleeding or problems urinating  Take Colace (Docusate Sodium/Stool Softener) 100 mg 2-3 times daily while taking narcotic pain medicine to avoid constipation or until bowel movements are regular.  Begin your birth control pills  (Noerthindrone 0.35 mg) today and take 1 daily;  Use condoms for 1 month to allow the contraceptive to take effect  Take with food,  Ibuprofen 600 mg every 6 hours for 3 days then as needed for pain.  You may drive after 24 hours You may shower tomorrow  You may resume a regular diet Keep incisions clean and dry  Avoid anything in vagina for 6 weeks until after your post-operative visit   Post Anesthesia Home Care Instructions  Activity: Get plenty of rest for the remainder of the day. A responsible adult should stay with you for 24 hours following the procedure.  For the next 24 hours, DO NOT: -Drive a car -Advertising copywriter -Drink alcoholic beverages -Take any medication unless instructed by your physician -Make any legal decisions or sign important papers.  Meals: Start with liquid foods such as gelatin or soup. Progress to regular foods as tolerated. Avoid greasy, spicy, heavy foods. If nausea and/or vomiting occur, drink only clear liquids until the nausea and/or vomiting subsides. Call your physician if vomiting continues.  Special Instructions/Symptoms: Your throat may feel dry or sore from the anesthesia or the breathing tube placed in your throat during surgery. If this causes discomfort, gargle with warm salt water. The discomfort should disappear within 24 hours.  If you had a scopolamine patch placed behind your ear for the management of post- operative nausea and/or vomiting:  1. The medication in the patch  is effective for 72 hours, after which it should be removed.  Wrap patch in a tissue and discard in the trash. Wash hands thoroughly with soap and water. 2. You may remove the patch earlier than 72 hours if you experience unpleasant side effects which may include dry mouth, dizziness or visual disturbances. 3. Avoid touching the patch. Wash your hands with soap and water after contact with the patch.

## 2017-11-29 NOTE — Op Note (Signed)
agnostic Laparoscopy Procedure Note  Indications: The patient is a 32 y.o. female with retained IUD for six years.  Pre-operative Diagnosis: intraabdominal IUD  Post-operative Diagnosis: same  Surgeon: ZOXWRUE,AVWUJ A   Assistants: E. Lowell Guitar PA  Anesthesia: General endotracheal anesthesia and Local anesthesia .25 % marcaine with epinephrine.    ASA Class: per anesthesia  Procedure Details  The patient was seen in the Holding Room. The risks, benefits, complications, treatment options, and expected outcomes were discussed with the patient. The possibilities of reaction to medication, pulmonary aspiration, perforation of viscus, bleeding, recurrent infection, the need for additional procedures, failure to diagnose a condition, and creating a complication requiring transfusion or operation were discussed with the patient. The patient concurred with the proposed plan, giving informed consent. The patient was taken to the Operating Room, identified as Erin Garner and the procedure verified as Diagnostic Laparoscopy. A Time Out was held and the above information confirmed.  After induction of general anesthesia, the patient was placed in modified dorsal lithotomy position where she was prepped, draped, and catheterized in the normal, sterile fashion.\  Speculum was placed in the vagina.  The cervix grasped with a single tooth tenaculum and dilated with pratt dilators.  The hysteroscope was placed into the uterus and a small hole was seen posteriorly.  Both ostia were visualized and no IUD was seen in the uterus.  Hysteroscope was removed.    The cervix was visualized and an intrauterine manipulator was placed. A 1 cm umbilical incision was then performed. And carried down to the fascia.  Fascia was incised with a knife.  The peritoneum was entered with a hemostat. The fascial incision was was extended with mayo scissors.  A circumferential suture was placed around the fascia and anchored to  the hasson once placed in the abdomen.  The patient had normal appearing liver, uterus, tubes and ovaries.  .  The IUD was identified in the culdesac.   The operative scope was used and a grasper was used to remove it.   Following the procedure the umbilical hasson was removed after intra-abdominal carbon dioxide was expressed. The incision was closed with subcutaneous and subcuticular sutures of 4-0 Vicryl. The intrauterine manipulator was then removed.  Instrument, sponge, and needle counts were correct prior to abdominal closure and at the conclusion of the case.   Findings: The anterior cul-de-sac and round ligaments normal The uterus normal The adnexa normal Appendix and liver appeared normal  Estimated Blood Loss:  Minimal         Drains: none         Total IV Fluids:         Specimens: IUD              Complications:  None; patient tolerated the procedure well.         Disposition: PACU - hemodynamically stable.         Condition: stable

## 2017-11-29 NOTE — Transfer of Care (Signed)
Immediate Anesthesia Transfer of Care Note  Patient: Erin Garner  Procedure(s) Performed: DILATATION & CURETTAGE/HYSTEROSCOPY WITH RESECTOCOPE (N/A ) LAPAROSCOPY OPERATIVE to Retrieve Displaced IUD (N/A )  Patient Location: PACU  Anesthesia Type:General  Level of Consciousness: awake, alert  and oriented  Airway & Oxygen Therapy: Patient Spontanous Breathing and Patient connected to nasal cannula oxygen  Post-op Assessment: Report given to RN and Post -op Vital signs reviewed and stable  Post vital signs: Reviewed and stable  Last Vitals:  Vitals Value Taken Time  BP    Temp    Pulse 77 11/29/2017  2:51 PM  Resp    SpO2 96 % 11/29/2017  2:51 PM  Vitals shown include unvalidated device data.  Last Pain:  Vitals:   11/29/17 1144  TempSrc:   PainSc: 0-No pain      Patients Stated Pain Goal: 6 (11/29/17 1144)  Complications: No apparent anesthesia complications

## 2017-11-30 ENCOUNTER — Encounter (HOSPITAL_BASED_OUTPATIENT_CLINIC_OR_DEPARTMENT_OTHER): Payer: Self-pay | Admitting: Obstetrics and Gynecology

## 2017-11-30 NOTE — Anesthesia Postprocedure Evaluation (Signed)
Anesthesia Post Note  Patient: Erin Garner  Procedure(s) Performed: Melton Krebs (N/A ) LAPAROSCOPY OPERATIVE to Retrieve Displaced IUD (N/A )     Patient location during evaluation: PACU Anesthesia Type: General Level of consciousness: awake and alert Pain management: pain level controlled Vital Signs Assessment: post-procedure vital signs reviewed and stable Respiratory status: spontaneous breathing, nonlabored ventilation, respiratory function stable and patient connected to nasal cannula oxygen Cardiovascular status: blood pressure returned to baseline and stable Postop Assessment: no apparent nausea or vomiting Anesthetic complications: no    Last Vitals:  Vitals:   11/29/17 1600 11/29/17 1630  BP: 118/69 119/75  Pulse: (!) 55 60  Resp: 13 14  Temp:  36.6 C  SpO2: 97% 96%    Last Pain:  Vitals:   11/29/17 1630  TempSrc:   PainSc: 2                  Tomicka Lover S

## 2017-12-02 LAB — AEROBIC CULTURE W GRAM STAIN (SUPERFICIAL SPECIMEN): Culture: NO GROWTH

## 2017-12-02 LAB — AEROBIC CULTURE  (SUPERFICIAL SPECIMEN)

## 2017-12-05 LAB — ANAEROBIC CULTURE

## 2018-12-06 ENCOUNTER — Ambulatory Visit (INDEPENDENT_AMBULATORY_CARE_PROVIDER_SITE_OTHER): Payer: BC Managed Care – PPO | Admitting: Psychology

## 2018-12-06 DIAGNOSIS — F4323 Adjustment disorder with mixed anxiety and depressed mood: Secondary | ICD-10-CM | POA: Diagnosis not present

## 2018-12-13 ENCOUNTER — Ambulatory Visit (INDEPENDENT_AMBULATORY_CARE_PROVIDER_SITE_OTHER): Payer: BC Managed Care – PPO | Admitting: Psychology

## 2018-12-13 DIAGNOSIS — F4323 Adjustment disorder with mixed anxiety and depressed mood: Secondary | ICD-10-CM

## 2018-12-20 ENCOUNTER — Ambulatory Visit (INDEPENDENT_AMBULATORY_CARE_PROVIDER_SITE_OTHER): Payer: BC Managed Care – PPO | Admitting: Psychology

## 2018-12-20 DIAGNOSIS — F4323 Adjustment disorder with mixed anxiety and depressed mood: Secondary | ICD-10-CM

## 2019-01-09 ENCOUNTER — Ambulatory Visit: Payer: BC Managed Care – PPO | Admitting: Psychology

## 2019-01-16 ENCOUNTER — Other Ambulatory Visit: Payer: Self-pay

## 2019-01-16 DIAGNOSIS — Z20822 Contact with and (suspected) exposure to covid-19: Secondary | ICD-10-CM

## 2019-01-17 LAB — NOVEL CORONAVIRUS, NAA: SARS-CoV-2, NAA: NOT DETECTED

## 2019-01-31 ENCOUNTER — Ambulatory Visit (INDEPENDENT_AMBULATORY_CARE_PROVIDER_SITE_OTHER): Payer: BC Managed Care – PPO | Admitting: Psychology

## 2019-01-31 DIAGNOSIS — F4323 Adjustment disorder with mixed anxiety and depressed mood: Secondary | ICD-10-CM | POA: Diagnosis not present

## 2019-03-02 ENCOUNTER — Ambulatory Visit (INDEPENDENT_AMBULATORY_CARE_PROVIDER_SITE_OTHER): Payer: BC Managed Care – PPO | Admitting: Psychology

## 2019-03-02 DIAGNOSIS — F4323 Adjustment disorder with mixed anxiety and depressed mood: Secondary | ICD-10-CM

## 2019-03-06 DIAGNOSIS — F411 Generalized anxiety disorder: Secondary | ICD-10-CM | POA: Diagnosis not present

## 2019-03-06 DIAGNOSIS — F331 Major depressive disorder, recurrent, moderate: Secondary | ICD-10-CM | POA: Diagnosis not present

## 2019-03-27 ENCOUNTER — Ambulatory Visit: Payer: BC Managed Care – PPO | Attending: Internal Medicine

## 2019-03-27 ENCOUNTER — Other Ambulatory Visit: Payer: Self-pay

## 2019-03-27 DIAGNOSIS — Z20822 Contact with and (suspected) exposure to covid-19: Secondary | ICD-10-CM

## 2019-03-28 ENCOUNTER — Other Ambulatory Visit: Payer: 59

## 2019-03-28 LAB — NOVEL CORONAVIRUS, NAA: SARS-CoV-2, NAA: NOT DETECTED

## 2019-04-07 DIAGNOSIS — F331 Major depressive disorder, recurrent, moderate: Secondary | ICD-10-CM | POA: Diagnosis not present

## 2019-05-10 DIAGNOSIS — R5383 Other fatigue: Secondary | ICD-10-CM | POA: Diagnosis not present

## 2019-05-10 DIAGNOSIS — Z20828 Contact with and (suspected) exposure to other viral communicable diseases: Secondary | ICD-10-CM | POA: Diagnosis not present

## 2019-05-10 DIAGNOSIS — J029 Acute pharyngitis, unspecified: Secondary | ICD-10-CM | POA: Diagnosis not present

## 2019-05-10 DIAGNOSIS — Z03818 Encounter for observation for suspected exposure to other biological agents ruled out: Secondary | ICD-10-CM | POA: Diagnosis not present

## 2019-11-15 DIAGNOSIS — J029 Acute pharyngitis, unspecified: Secondary | ICD-10-CM | POA: Diagnosis not present

## 2019-11-15 DIAGNOSIS — Z03818 Encounter for observation for suspected exposure to other biological agents ruled out: Secondary | ICD-10-CM | POA: Diagnosis not present

## 2019-11-18 IMAGING — CR DG ABDOMEN 1V
1 series · 1 of 1 positions shown · non-contrast
Comparison: None.

CLINICAL DATA: Clinical concern of possible IUD displacement since
the identification strains cannot be visualized.

EXAM:
ABDOMEN - 1 VIEW

[w abdomen upright]
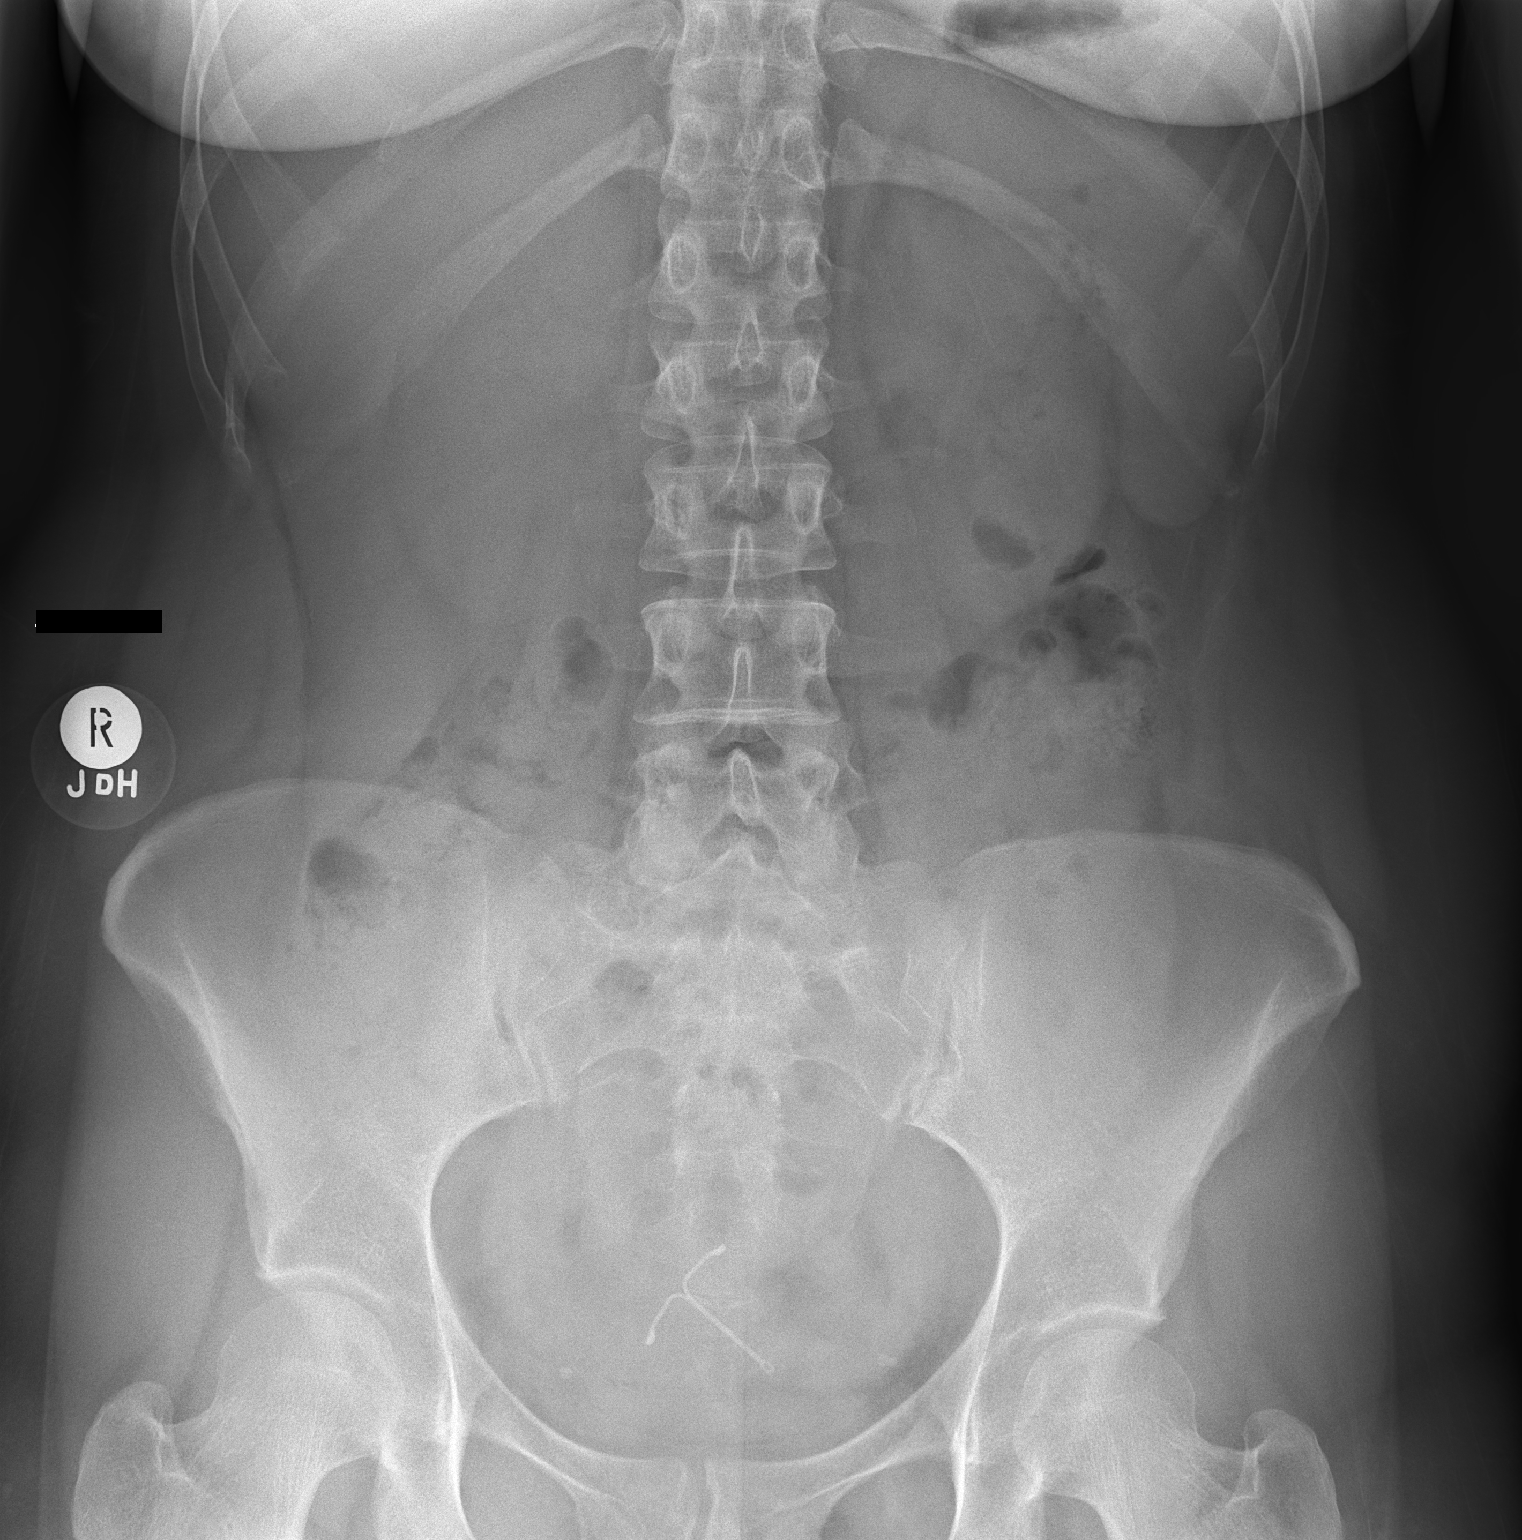

[1 of 1 positions shown; findings below may reference images not displayed]

FINDINGS: The IUD is visible in the mid pelvic cavity. The bowel gas pattern
is normal. There are no abnormal soft tissue calcifications. The
bony structures are unremarkable.
IMPRESSION: The IUD is present in the midline within the pelvis.

## 2020-07-17 DIAGNOSIS — R55 Syncope and collapse: Secondary | ICD-10-CM | POA: Diagnosis not present

## 2020-07-17 DIAGNOSIS — N946 Dysmenorrhea, unspecified: Secondary | ICD-10-CM | POA: Diagnosis not present

## 2020-07-26 DIAGNOSIS — F411 Generalized anxiety disorder: Secondary | ICD-10-CM | POA: Diagnosis not present

## 2020-08-21 DIAGNOSIS — Z01419 Encounter for gynecological examination (general) (routine) without abnormal findings: Secondary | ICD-10-CM | POA: Diagnosis not present

## 2020-09-30 DIAGNOSIS — L814 Other melanin hyperpigmentation: Secondary | ICD-10-CM | POA: Diagnosis not present

## 2020-09-30 DIAGNOSIS — D225 Melanocytic nevi of trunk: Secondary | ICD-10-CM | POA: Diagnosis not present

## 2020-09-30 DIAGNOSIS — D2262 Melanocytic nevi of left upper limb, including shoulder: Secondary | ICD-10-CM | POA: Diagnosis not present

## 2020-09-30 DIAGNOSIS — L578 Other skin changes due to chronic exposure to nonionizing radiation: Secondary | ICD-10-CM | POA: Diagnosis not present

## 2021-03-17 DIAGNOSIS — J069 Acute upper respiratory infection, unspecified: Secondary | ICD-10-CM | POA: Diagnosis not present

## 2021-03-17 DIAGNOSIS — Z20822 Contact with and (suspected) exposure to covid-19: Secondary | ICD-10-CM | POA: Diagnosis not present

## 2021-05-19 DIAGNOSIS — F411 Generalized anxiety disorder: Secondary | ICD-10-CM | POA: Diagnosis not present

## 2021-05-19 DIAGNOSIS — G4719 Other hypersomnia: Secondary | ICD-10-CM | POA: Diagnosis not present

## 2021-06-09 DIAGNOSIS — R5383 Other fatigue: Secondary | ICD-10-CM | POA: Diagnosis not present

## 2021-06-09 DIAGNOSIS — J029 Acute pharyngitis, unspecified: Secondary | ICD-10-CM | POA: Diagnosis not present

## 2021-06-09 DIAGNOSIS — F411 Generalized anxiety disorder: Secondary | ICD-10-CM | POA: Diagnosis not present

## 2021-07-10 DIAGNOSIS — E538 Deficiency of other specified B group vitamins: Secondary | ICD-10-CM | POA: Diagnosis not present

## 2021-07-19 DIAGNOSIS — G4710 Hypersomnia, unspecified: Secondary | ICD-10-CM | POA: Diagnosis not present

## 2021-07-23 DIAGNOSIS — F411 Generalized anxiety disorder: Secondary | ICD-10-CM | POA: Diagnosis not present

## 2021-07-24 DIAGNOSIS — G4711 Idiopathic hypersomnia with long sleep time: Secondary | ICD-10-CM | POA: Diagnosis not present

## 2021-08-19 DIAGNOSIS — F411 Generalized anxiety disorder: Secondary | ICD-10-CM | POA: Diagnosis not present

## 2021-09-10 DIAGNOSIS — F411 Generalized anxiety disorder: Secondary | ICD-10-CM | POA: Diagnosis not present

## 2021-10-14 DIAGNOSIS — G4711 Idiopathic hypersomnia with long sleep time: Secondary | ICD-10-CM | POA: Diagnosis not present

## 2022-08-25 ENCOUNTER — Ambulatory Visit
Admission: RE | Admit: 2022-08-25 | Discharge: 2022-08-25 | Disposition: A | Discharge status: Home / Self Care | Payer: Commercial Managed Care - PPO | Source: Ambulatory Visit | Attending: Family Medicine | Admitting: Family Medicine

## 2022-08-25 ENCOUNTER — Other Ambulatory Visit: Payer: Self-pay | Admitting: Family Medicine

## 2022-08-25 DIAGNOSIS — R0602 Shortness of breath: Secondary | ICD-10-CM

## 2023-08-24 DIAGNOSIS — F411 Generalized anxiety disorder: Secondary | ICD-10-CM | POA: Diagnosis not present

## 2023-09-14 DIAGNOSIS — G4711 Idiopathic hypersomnia with long sleep time: Secondary | ICD-10-CM | POA: Diagnosis not present

## 2023-10-01 DIAGNOSIS — Z0000 Encounter for general adult medical examination without abnormal findings: Secondary | ICD-10-CM | POA: Diagnosis not present

## 2023-10-01 DIAGNOSIS — G4711 Idiopathic hypersomnia with long sleep time: Secondary | ICD-10-CM | POA: Diagnosis not present

## 2023-10-01 DIAGNOSIS — F411 Generalized anxiety disorder: Secondary | ICD-10-CM | POA: Diagnosis not present

## 2023-10-01 DIAGNOSIS — D500 Iron deficiency anemia secondary to blood loss (chronic): Secondary | ICD-10-CM | POA: Diagnosis not present

## 2023-11-02 DIAGNOSIS — F411 Generalized anxiety disorder: Secondary | ICD-10-CM | POA: Diagnosis not present

## 2023-11-05 DIAGNOSIS — D225 Melanocytic nevi of trunk: Secondary | ICD-10-CM | POA: Diagnosis not present

## 2023-11-05 DIAGNOSIS — L578 Other skin changes due to chronic exposure to nonionizing radiation: Secondary | ICD-10-CM | POA: Diagnosis not present

## 2023-11-05 DIAGNOSIS — L814 Other melanin hyperpigmentation: Secondary | ICD-10-CM | POA: Diagnosis not present

## 2023-11-05 DIAGNOSIS — D2262 Melanocytic nevi of left upper limb, including shoulder: Secondary | ICD-10-CM | POA: Diagnosis not present

## 2023-11-05 DIAGNOSIS — D485 Neoplasm of uncertain behavior of skin: Secondary | ICD-10-CM | POA: Diagnosis not present

## 2023-11-08 DIAGNOSIS — T7840XA Allergy, unspecified, initial encounter: Secondary | ICD-10-CM | POA: Diagnosis not present

## 2023-11-12 DIAGNOSIS — L508 Other urticaria: Secondary | ICD-10-CM | POA: Diagnosis not present

## 2023-12-17 DIAGNOSIS — J3089 Other allergic rhinitis: Secondary | ICD-10-CM | POA: Diagnosis not present

## 2023-12-17 DIAGNOSIS — J4520 Mild intermittent asthma, uncomplicated: Secondary | ICD-10-CM | POA: Diagnosis not present

## 2023-12-17 DIAGNOSIS — J301 Allergic rhinitis due to pollen: Secondary | ICD-10-CM | POA: Diagnosis not present

## 2023-12-17 DIAGNOSIS — L309 Dermatitis, unspecified: Secondary | ICD-10-CM | POA: Diagnosis not present
